# Patient Record
Sex: Female | Born: 2000 | Race: White | Hispanic: No | Marital: Single | Smoking: Former smoker
Health system: Southern US, Community
[De-identification: ages and names within clinical notes are randomized; demographics above are authoritative.]

## PROBLEM LIST (undated history)

## (undated) DIAGNOSIS — R519 Headache, unspecified: Secondary | ICD-10-CM

## (undated) DIAGNOSIS — J45909 Unspecified asthma, uncomplicated: Secondary | ICD-10-CM

## (undated) DIAGNOSIS — F909 Attention-deficit hyperactivity disorder, unspecified type: Secondary | ICD-10-CM

## (undated) DIAGNOSIS — O139 Gestational [pregnancy-induced] hypertension without significant proteinuria, unspecified trimester: Secondary | ICD-10-CM

## (undated) DIAGNOSIS — R51 Headache: Secondary | ICD-10-CM

## (undated) DIAGNOSIS — G43019 Migraine without aura, intractable, without status migrainosus: Secondary | ICD-10-CM

## (undated) DIAGNOSIS — T50902A Poisoning by unspecified drugs, medicaments and biological substances, intentional self-harm, initial encounter: Secondary | ICD-10-CM

## (undated) DIAGNOSIS — B009 Herpesviral infection, unspecified: Secondary | ICD-10-CM

## (undated) DIAGNOSIS — F329 Major depressive disorder, single episode, unspecified: Secondary | ICD-10-CM

## (undated) DIAGNOSIS — F419 Anxiety disorder, unspecified: Secondary | ICD-10-CM

## (undated) DIAGNOSIS — F32A Depression, unspecified: Secondary | ICD-10-CM

## (undated) HISTORY — DX: Headache, unspecified: R51.9

## (undated) HISTORY — DX: Migraine without aura, intractable, without status migrainosus: G43.019

## (undated) HISTORY — PX: NO PAST SURGERIES: SHX2092

## (undated) HISTORY — DX: Headache: R51

---
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
## 2018-04-19 ENCOUNTER — Inpatient Hospital Stay (HOSPITAL_COMMUNITY): Payer: Medicaid Other

## 2018-04-19 ENCOUNTER — Inpatient Hospital Stay (HOSPITAL_COMMUNITY)
Admission: AD | Admit: 2018-04-19 | Discharge: 2018-04-21 | DRG: 788 | Disposition: A | Discharge status: Home / Self Care | Payer: Medicaid Other | Attending: Obstetrics & Gynecology | Admitting: Obstetrics & Gynecology

## 2018-04-19 ENCOUNTER — Encounter (HOSPITAL_COMMUNITY): Payer: Self-pay

## 2018-04-19 ENCOUNTER — Other Ambulatory Visit: Payer: Self-pay

## 2018-04-19 ENCOUNTER — Encounter (HOSPITAL_COMMUNITY)
Admission: AD | Disposition: A | Discharge status: Home / Self Care | Payer: Self-pay | Source: Home / Self Care | Attending: Obstetrics & Gynecology

## 2018-04-19 DIAGNOSIS — Z23 Encounter for immunization: Secondary | ICD-10-CM

## 2018-04-19 DIAGNOSIS — O1494 Unspecified pre-eclampsia, complicating childbirth: Secondary | ICD-10-CM | POA: Diagnosis present

## 2018-04-19 DIAGNOSIS — F319 Bipolar disorder, unspecified: Secondary | ICD-10-CM | POA: Diagnosis present

## 2018-04-19 DIAGNOSIS — O134 Gestational [pregnancy-induced] hypertension without significant proteinuria, complicating childbirth: Secondary | ICD-10-CM | POA: Diagnosis present

## 2018-04-19 DIAGNOSIS — B009 Herpesviral infection, unspecified: Secondary | ICD-10-CM | POA: Diagnosis present

## 2018-04-19 DIAGNOSIS — Z87891 Personal history of nicotine dependence: Secondary | ICD-10-CM

## 2018-04-19 DIAGNOSIS — O9832 Other infections with a predominantly sexual mode of transmission complicating childbirth: Secondary | ICD-10-CM | POA: Diagnosis present

## 2018-04-19 DIAGNOSIS — G43019 Migraine without aura, intractable, without status migrainosus: Secondary | ICD-10-CM | POA: Diagnosis present

## 2018-04-19 DIAGNOSIS — Z9151 Personal history of suicidal behavior: Secondary | ICD-10-CM

## 2018-04-19 DIAGNOSIS — J45909 Unspecified asthma, uncomplicated: Secondary | ICD-10-CM | POA: Diagnosis present

## 2018-04-19 DIAGNOSIS — O9952 Diseases of the respiratory system complicating childbirth: Secondary | ICD-10-CM | POA: Diagnosis not present

## 2018-04-19 DIAGNOSIS — F902 Attention-deficit hyperactivity disorder, combined type: Secondary | ICD-10-CM | POA: Diagnosis present

## 2018-04-19 DIAGNOSIS — A6000 Herpesviral infection of urogenital system, unspecified: Secondary | ICD-10-CM | POA: Diagnosis present

## 2018-04-19 DIAGNOSIS — Z98891 History of uterine scar from previous surgery: Secondary | ICD-10-CM

## 2018-04-19 DIAGNOSIS — Z3A37 37 weeks gestation of pregnancy: Secondary | ICD-10-CM

## 2018-04-19 DIAGNOSIS — Z79899 Other long term (current) drug therapy: Secondary | ICD-10-CM

## 2018-04-19 DIAGNOSIS — O98513 Other viral diseases complicating pregnancy, third trimester: Secondary | ICD-10-CM | POA: Diagnosis present

## 2018-04-19 DIAGNOSIS — Z915 Personal history of self-harm: Secondary | ICD-10-CM

## 2018-04-19 DIAGNOSIS — F913 Oppositional defiant disorder: Secondary | ICD-10-CM | POA: Diagnosis present

## 2018-04-19 DIAGNOSIS — O1493 Unspecified pre-eclampsia, third trimester: Secondary | ICD-10-CM | POA: Diagnosis not present

## 2018-04-19 HISTORY — DX: Herpesviral infection, unspecified: B00.9

## 2018-04-19 HISTORY — DX: Gestational (pregnancy-induced) hypertension without significant proteinuria, unspecified trimester: O13.9

## 2018-04-19 LAB — COMPREHENSIVE METABOLIC PANEL
ALBUMIN: 3 g/dL — AB (ref 3.5–5.0)
ALK PHOS: 108 U/L (ref 38–126)
ALT: 11 U/L (ref 0–44)
ANION GAP: 9 (ref 5–15)
AST: 16 U/L (ref 15–41)
BILIRUBIN TOTAL: 0.5 mg/dL (ref 0.3–1.2)
BUN: 7 mg/dL (ref 6–20)
CALCIUM: 8.7 mg/dL — AB (ref 8.9–10.3)
CO2: 18 mmol/L — ABNORMAL LOW (ref 22–32)
Chloride: 107 mmol/L (ref 98–111)
Creatinine, Ser: 0.49 mg/dL (ref 0.44–1.00)
GFR calc Af Amer: >60 mL/min (ref >60)
GFR calc non Af Amer: >60 mL/min (ref >60)
GLUCOSE: 82 mg/dL (ref 70–99)
Potassium: 4.2 mmol/L (ref 3.5–5.1)
Sodium: 134 mmol/L — ABNORMAL LOW (ref 135–145)
TOTAL PROTEIN: 7.2 g/dL (ref 6.5–8.1)

## 2018-04-19 LAB — OB RESULTS CONSOLE GBS: GBS: NEGATIVE

## 2018-04-19 LAB — CBC
HEMATOCRIT: 36.5 % (ref 36.0–46.0)
HEMOGLOBIN: 11.9 g/dL — AB (ref 12.0–15.0)
MCH: 29.3 pg (ref 26.0–34.0)
MCHC: 32.6 g/dL (ref 30.0–36.0)
MCV: 89.9 fL (ref 80.0–100.0)
Platelets: 208 10*3/uL (ref 150–400)
RBC: 4.06 MIL/uL (ref 3.87–5.11)
RDW: 14.3 % (ref 11.5–15.5)
WBC: 8.2 10*3/uL (ref 4.0–10.5)
nRBC: 0 % (ref 0.0–0.2)

## 2018-04-19 LAB — RAPID URINE DRUG SCREEN, HOSP PERFORMED
Amphetamines: NOT DETECTED
Barbiturates: POSITIVE — AB
Benzodiazepines: NOT DETECTED
Cocaine: NOT DETECTED
Opiates: NOT DETECTED
Tetrahydrocannabinol: NOT DETECTED

## 2018-04-19 LAB — TYPE AND SCREEN
ABO/RH(D): A POS
Antibody Screen: NEGATIVE

## 2018-04-19 LAB — URINALYSIS, ROUTINE W REFLEX MICROSCOPIC
Bilirubin Urine: NEGATIVE
GLUCOSE, UA: NEGATIVE mg/dL
Ketones, ur: NEGATIVE mg/dL
NITRITE: NEGATIVE
PH: 7 (ref 5.0–8.0)
Protein, ur: NEGATIVE mg/dL
SPECIFIC GRAVITY, URINE: 1.013 (ref 1.005–1.030)

## 2018-04-19 LAB — PROTEIN / CREATININE RATIO, URINE
Creatinine, Urine: 73 mg/dL
PROTEIN CREATININE RATIO: 0.27 mg/mg{creat} — AB (ref 0.00–0.15)
Total Protein, Urine: 20 mg/dL

## 2018-04-19 LAB — ABO/RH: ABO/RH(D): A POS

## 2018-04-19 SURGERY — Surgical Case
Anesthesia: Spinal | Site: Abdomen | Wound class: Clean Contaminated

## 2018-04-19 MED ORDER — DIPHENHYDRAMINE HCL 25 MG PO CAPS
25.0000 mg | ORAL_CAPSULE | Freq: Four times a day (QID) | ORAL | Status: DC | PRN
  Administered 2018-04-19: 25 mg via ORAL
  Filled 2018-04-19: qty 1

## 2018-04-19 MED ORDER — PHENYLEPHRINE 8 MG IN D5W 100 ML (0.08MG/ML) PREMIX OPTIME
INJECTION | INTRAVENOUS | Status: DC | PRN
  Administered 2018-04-19: 30 ug/min via INTRAVENOUS

## 2018-04-19 MED ORDER — ENOXAPARIN SODIUM 40 MG/0.4ML ~~LOC~~ SOLN
40.0000 mg | SUBCUTANEOUS | Status: DC
  Administered 2018-04-20: 40 mg via SUBCUTANEOUS
  Filled 2018-04-19: qty 0.4

## 2018-04-19 MED ORDER — OXYCODONE-ACETAMINOPHEN 5-325 MG PO TABS
2.0000 | ORAL_TABLET | ORAL | Status: DC | PRN
  Administered 2018-04-21 (×3): 2 via ORAL
  Filled 2018-04-19 (×3): qty 2

## 2018-04-19 MED ORDER — PRENATAL MULTIVITAMIN CH
1.0000 | ORAL_TABLET | Freq: Every day | ORAL | Status: DC
  Administered 2018-04-20 – 2018-04-21 (×2): 1 via ORAL
  Filled 2018-04-19 (×2): qty 1

## 2018-04-19 MED ORDER — OXYCODONE HCL 5 MG/5ML PO SOLN: 5.0000 mg | Freq: Once | ORAL | Status: DC | PRN

## 2018-04-19 MED ORDER — FENTANYL CITRATE (PF) 100 MCG/2ML IJ SOLN
INTRAMUSCULAR | Status: AC
  Filled 2018-04-19: qty 2

## 2018-04-19 MED ORDER — OXYTOCIN 40 UNITS IN NORMAL SALINE INFUSION - SIMPLE MED: 2.5000 [IU]/h | INTRAVENOUS | Status: AC

## 2018-04-19 MED ORDER — PHENYLEPHRINE 8 MG IN D5W 100 ML (0.08MG/ML) PREMIX OPTIME
INJECTION | INTRAVENOUS | Status: AC
  Filled 2018-04-19: qty 100

## 2018-04-19 MED ORDER — BUPIVACAINE HCL (PF) 0.5 % IJ SOLN
INTRAMUSCULAR | Status: AC
  Filled 2018-04-19: qty 30

## 2018-04-19 MED ORDER — SIMETHICONE 80 MG PO CHEW
80.0000 mg | CHEWABLE_TABLET | ORAL | Status: DC
  Administered 2018-04-20 (×2): 80 mg via ORAL
  Filled 2018-04-19: qty 1

## 2018-04-19 MED ORDER — OXYTOCIN 10 UNIT/ML IJ SOLN
INTRAVENOUS | Status: DC | PRN
  Administered 2018-04-19: 40 [IU] via INTRAVENOUS

## 2018-04-19 MED ORDER — MAGNESIUM HYDROXIDE 400 MG/5ML PO SUSP: 30.0000 mL | ORAL | Status: DC | PRN

## 2018-04-19 MED ORDER — MORPHINE SULFATE (PF) 0.5 MG/ML IJ SOLN
INTRAMUSCULAR | Status: DC | PRN
  Administered 2018-04-19: .15 ug via INTRATHECAL

## 2018-04-19 MED ORDER — WITCH HAZEL-GLYCERIN EX PADS: 1.0000 "application " | MEDICATED_PAD | CUTANEOUS | Status: DC | PRN

## 2018-04-19 MED ORDER — HYDROMORPHONE HCL 1 MG/ML IJ SOLN: 0.2500 mg | INTRAMUSCULAR | Status: DC | PRN

## 2018-04-19 MED ORDER — GLYCOPYRROLATE 0.2 MG/ML IJ SOLN
INTRAMUSCULAR | Status: DC | PRN
  Administered 2018-04-19: 0.2 mg via INTRAVENOUS

## 2018-04-19 MED ORDER — LACTATED RINGERS IV SOLN
INTRAVENOUS | Status: DC
  Administered 2018-04-19: 17:00:00 via INTRAVENOUS

## 2018-04-19 MED ORDER — SENNOSIDES-DOCUSATE SODIUM 8.6-50 MG PO TABS
2.0000 | ORAL_TABLET | ORAL | Status: DC
  Administered 2018-04-19 – 2018-04-20 (×2): 2 via ORAL
  Filled 2018-04-19: qty 2

## 2018-04-19 MED ORDER — ACETAMINOPHEN 325 MG PO TABS: 650.0000 mg | ORAL_TABLET | ORAL | Status: DC | PRN

## 2018-04-19 MED ORDER — COCONUT OIL OIL: 1.0000 "application " | TOPICAL_OIL | Status: DC | PRN

## 2018-04-19 MED ORDER — ZOLPIDEM TARTRATE 5 MG PO TABS: 5.0000 mg | ORAL_TABLET | Freq: Every evening | ORAL | Status: DC | PRN

## 2018-04-19 MED ORDER — MEPERIDINE HCL 25 MG/ML IJ SOLN: 6.2500 mg | INTRAMUSCULAR | Status: DC | PRN

## 2018-04-19 MED ORDER — TETANUS-DIPHTH-ACELL PERTUSSIS 5-2.5-18.5 LF-MCG/0.5 IM SUSP: 0.5000 mL | Freq: Once | INTRAMUSCULAR | Status: DC

## 2018-04-19 MED ORDER — SCOPOLAMINE 1 MG/3DAYS TD PT72
MEDICATED_PATCH | TRANSDERMAL | Status: AC
  Filled 2018-04-19: qty 1

## 2018-04-19 MED ORDER — GABAPENTIN 300 MG PO CAPS
300.0000 mg | ORAL_CAPSULE | Freq: Three times a day (TID) | ORAL | Status: DC
  Administered 2018-04-19 – 2018-04-21 (×6): 300 mg via ORAL
  Filled 2018-04-19 (×9): qty 1

## 2018-04-19 MED ORDER — ONDANSETRON HCL 4 MG/2ML IJ SOLN
INTRAMUSCULAR | Status: AC
  Filled 2018-04-19: qty 2

## 2018-04-19 MED ORDER — MENTHOL 3 MG MT LOZG: 1.0000 | LOZENGE | OROMUCOSAL | Status: DC | PRN

## 2018-04-19 MED ORDER — MEPERIDINE HCL 25 MG/ML IJ SOLN
INTRAMUSCULAR | Status: AC
  Filled 2018-04-19: qty 1

## 2018-04-19 MED ORDER — OXYTOCIN 40 UNITS IN NORMAL SALINE INFUSION - SIMPLE MED: 2.5000 [IU]/h | INTRAVENOUS | Status: DC

## 2018-04-19 MED ORDER — LACTATED RINGERS IV SOLN: 500.0000 mL | INTRAVENOUS | Status: DC | PRN

## 2018-04-19 MED ORDER — CEFAZOLIN SODIUM-DEXTROSE 2-4 GM/100ML-% IV SOLN
INTRAVENOUS | Status: AC
  Filled 2018-04-19: qty 100

## 2018-04-19 MED ORDER — TRAMADOL HCL 50 MG PO TABS
50.0000 mg | ORAL_TABLET | Freq: Four times a day (QID) | ORAL | Status: DC | PRN
  Administered 2018-04-20 (×2): 50 mg via ORAL
  Filled 2018-04-19 (×2): qty 1

## 2018-04-19 MED ORDER — PROMETHAZINE HCL 25 MG/ML IJ SOLN: 6.2500 mg | INTRAMUSCULAR | Status: DC | PRN

## 2018-04-19 MED ORDER — SIMETHICONE 80 MG PO CHEW
80.0000 mg | CHEWABLE_TABLET | ORAL | Status: DC | PRN
  Administered 2018-04-19 – 2018-04-21 (×4): 80 mg via ORAL
  Filled 2018-04-19 (×4): qty 1

## 2018-04-19 MED ORDER — ONDANSETRON HCL 4 MG/2ML IJ SOLN
4.0000 mg | Freq: Four times a day (QID) | INTRAMUSCULAR | Status: DC | PRN
  Administered 2018-04-19: 4 mg via INTRAVENOUS

## 2018-04-19 MED ORDER — OXYCODONE-ACETAMINOPHEN 5-325 MG PO TABS: 1.0000 | ORAL_TABLET | ORAL | Status: DC | PRN

## 2018-04-19 MED ORDER — LACTATED RINGERS IV SOLN
INTRAVENOUS | Status: DC
  Administered 2018-04-20: 05:00:00 via INTRAVENOUS

## 2018-04-19 MED ORDER — OXYCODONE-ACETAMINOPHEN 5-325 MG PO TABS: 2.0000 | ORAL_TABLET | ORAL | Status: DC | PRN

## 2018-04-19 MED ORDER — DEXAMETHASONE SODIUM PHOSPHATE 4 MG/ML IJ SOLN
INTRAMUSCULAR | Status: DC | PRN
  Administered 2018-04-19: 10 mg via INTRAVENOUS

## 2018-04-19 MED ORDER — CEFAZOLIN SODIUM-DEXTROSE 2-3 GM-%(50ML) IV SOLR
INTRAVENOUS | Status: DC | PRN
  Administered 2018-04-19: 2 g via INTRAVENOUS

## 2018-04-19 MED ORDER — DIBUCAINE 1 % RE OINT: 1.0000 "application " | TOPICAL_OINTMENT | RECTAL | Status: DC | PRN

## 2018-04-19 MED ORDER — IBUPROFEN 800 MG PO TABS: 800.0000 mg | ORAL_TABLET | Freq: Four times a day (QID) | ORAL | Status: DC

## 2018-04-19 MED ORDER — FERROUS SULFATE 325 (65 FE) MG PO TABS
325.0000 mg | ORAL_TABLET | Freq: Two times a day (BID) | ORAL | Status: DC
  Administered 2018-04-20 – 2018-04-21 (×3): 325 mg via ORAL
  Filled 2018-04-19 (×4): qty 1

## 2018-04-19 MED ORDER — OXYCODONE HCL 5 MG PO TABS: 5.0000 mg | ORAL_TABLET | Freq: Once | ORAL | Status: DC | PRN

## 2018-04-19 MED ORDER — SCOPOLAMINE 1 MG/3DAYS TD PT72
MEDICATED_PATCH | TRANSDERMAL | Status: DC | PRN
  Administered 2018-04-19: 1 via TRANSDERMAL

## 2018-04-19 MED ORDER — KETOROLAC TROMETHAMINE 30 MG/ML IJ SOLN
30.0000 mg | Freq: Once | INTRAMUSCULAR | Status: AC
  Administered 2018-04-20: 30 mg via INTRAVENOUS

## 2018-04-19 MED ORDER — KETOROLAC TROMETHAMINE 30 MG/ML IJ SOLN: 30.0000 mg | Freq: Once | INTRAMUSCULAR | Status: DC | PRN

## 2018-04-19 MED ORDER — LACTATED RINGERS IV BOLUS
1000.0000 mL | Freq: Once | INTRAVENOUS | Status: AC
  Administered 2018-04-19: 1000 mL via INTRAVENOUS

## 2018-04-19 MED ORDER — SOD CITRATE-CITRIC ACID 500-334 MG/5ML PO SOLN
30.0000 mL | ORAL | Status: DC | PRN
  Administered 2018-04-19: 30 mL via ORAL
  Filled 2018-04-19: qty 15

## 2018-04-19 MED ORDER — OXYTOCIN BOLUS FROM INFUSION: 500.0000 mL | Freq: Once | INTRAVENOUS | Status: DC

## 2018-04-19 MED ORDER — LACTATED RINGERS IV SOLN
INTRAVENOUS | Status: DC | PRN
  Administered 2018-04-19: 19:00:00 via INTRAVENOUS

## 2018-04-19 MED ORDER — MEPERIDINE HCL 25 MG/ML IJ SOLN
INTRAMUSCULAR | Status: DC | PRN
  Administered 2018-04-19: 12.5 mg via INTRAVENOUS

## 2018-04-19 MED ORDER — ALBUTEROL SULFATE (2.5 MG/3ML) 0.083% IN NEBU: 3.0000 mL | INHALATION_SOLUTION | Freq: Four times a day (QID) | RESPIRATORY_TRACT | Status: DC | PRN

## 2018-04-19 MED ORDER — KETOROLAC TROMETHAMINE 30 MG/ML IJ SOLN
30.0000 mg | Freq: Four times a day (QID) | INTRAMUSCULAR | Status: AC
  Administered 2018-04-20: 30 mg via INTRAVENOUS
  Filled 2018-04-19 (×2): qty 1

## 2018-04-19 MED ORDER — FENTANYL CITRATE (PF) 100 MCG/2ML IJ SOLN
INTRAMUSCULAR | Status: DC | PRN
  Administered 2018-04-19: 15 ug via INTRAVENOUS

## 2018-04-19 MED ORDER — MORPHINE SULFATE (PF) 0.5 MG/ML IJ SOLN
INTRAMUSCULAR | Status: AC
  Filled 2018-04-19: qty 10

## 2018-04-19 MED ORDER — BUPIVACAINE IN DEXTROSE 0.75-8.25 % IT SOLN
INTRATHECAL | Status: DC | PRN
  Administered 2018-04-19: 1.3 mL via INTRATHECAL

## 2018-04-19 MED ORDER — OXYTOCIN 10 UNIT/ML IJ SOLN
INTRAMUSCULAR | Status: AC
  Filled 2018-04-19: qty 4

## 2018-04-19 MED ORDER — HYDROMORPHONE HCL 1 MG/ML IJ SOLN
1.0000 mg | INTRAMUSCULAR | Status: DC | PRN
  Administered 2018-04-20: 1 mg via INTRAVENOUS
  Filled 2018-04-19: qty 1

## 2018-04-19 MED ORDER — VALACYCLOVIR HCL 500 MG PO TABS
500.0000 mg | ORAL_TABLET | Freq: Two times a day (BID) | ORAL | Status: DC
  Administered 2018-04-19 – 2018-04-21 (×4): 500 mg via ORAL
  Filled 2018-04-19 (×4): qty 1

## 2018-04-19 MED ORDER — FLEET ENEMA 7-19 GM/118ML RE ENEM: 1.0000 | ENEMA | RECTAL | Status: DC | PRN

## 2018-04-19 MED ORDER — MEASLES, MUMPS & RUBELLA VAC IJ SOLR: 0.5000 mL | Freq: Once | INTRAMUSCULAR | Status: DC

## 2018-04-19 MED ORDER — LIDOCAINE HCL (PF) 1 % IJ SOLN: 30.0000 mL | INTRAMUSCULAR | Status: DC | PRN

## 2018-04-19 MED ORDER — DEXAMETHASONE SODIUM PHOSPHATE 10 MG/ML IJ SOLN
INTRAMUSCULAR | Status: AC
  Filled 2018-04-19: qty 1

## 2018-04-19 SURGICAL SUPPLY — 30 items
CHLORAPREP W/TINT 26ML (MISCELLANEOUS) ×2 IMPLANT
CLAMP CORD UMBIL (MISCELLANEOUS) IMPLANT
CLOTH BEACON ORANGE TIMEOUT ST (SAFETY) ×2 IMPLANT
DRSG OPSITE POSTOP 4X10 (GAUZE/BANDAGES/DRESSINGS) ×2 IMPLANT
ELECT REM PT RETURN 9FT ADLT (ELECTROSURGICAL) ×2
ELECTRODE REM PT RTRN 9FT ADLT (ELECTROSURGICAL) ×1 IMPLANT
EXTRACTOR VACUUM M CUP 4 TUBE (SUCTIONS) IMPLANT
GAUZE SPONGE 4X4 12PLY STRL LF (GAUZE/BANDAGES/DRESSINGS) ×2 IMPLANT
GLOVE BIOGEL PI IND STRL 7.0 (GLOVE) ×3 IMPLANT
GLOVE BIOGEL PI INDICATOR 7.0 (GLOVE) ×3
GLOVE ECLIPSE 7.0 STRL STRAW (GLOVE) ×2 IMPLANT
GOWN STRL REUS W/TWL LRG LVL3 (GOWN DISPOSABLE) ×4 IMPLANT
KIT ABG SYR 3ML LUER SLIP (SYRINGE) IMPLANT
NEEDLE HYPO 22GX1.5 SAFETY (NEEDLE) ×2 IMPLANT
NEEDLE HYPO 25X5/8 SAFETYGLIDE (NEEDLE) ×2 IMPLANT
NS IRRIG 1000ML POUR BTL (IV SOLUTION) ×2 IMPLANT
PACK C SECTION WH (CUSTOM PROCEDURE TRAY) ×2 IMPLANT
PAD ABD 7.5X8 STRL (GAUZE/BANDAGES/DRESSINGS) ×2 IMPLANT
PAD ABD 8X10 STRL (GAUZE/BANDAGES/DRESSINGS) ×2 IMPLANT
PAD OB MATERNITY 4.3X12.25 (PERSONAL CARE ITEMS) ×2 IMPLANT
PENCIL SMOKE EVAC W/HOLSTER (ELECTROSURGICAL) ×2 IMPLANT
RTRCTR C-SECT PINK 25CM LRG (MISCELLANEOUS) IMPLANT
SUT PDS AB 0 CTX 36 PDP370T (SUTURE) IMPLANT
SUT PLAIN 2 0 XLH (SUTURE) IMPLANT
SUT VIC AB 0 CTX 36 (SUTURE) ×2
SUT VIC AB 0 CTX36XBRD ANBCTRL (SUTURE) ×2 IMPLANT
SUT VIC AB 4-0 KS 27 (SUTURE) ×2 IMPLANT
SYR CONTROL 10ML LL (SYRINGE) ×2 IMPLANT
TOWEL OR 17X24 6PK STRL BLUE (TOWEL DISPOSABLE) ×2 IMPLANT
TRAY FOLEY W/BAG SLVR 14FR LF (SET/KITS/TRAYS/PACK) ×2 IMPLANT

## 2018-04-19 NOTE — MAU Provider Note (Signed)
History     CSN: [id number]  Arrival date and time: 04/19/18 1322   First Provider Initiated Contact with Patient 04/19/18 1412      Chief Complaint  Patient presents with  . Vaginal Bleeding  . Back Pain   HPI  Ms.  Jodi Flores is a 18 y.o. year old [id number] female at 6646w0d weeks gestation who presents to MAU reporting intermittent VB/spotting. She was seen at the hospital in [place] last night for the same complaint. She reports "they didn't do anything for me there. They only monitored the baby and sent me home." She reports having gHTN with her last pregnancy. She is worried that she might have that again. She reports her BP was 140/90 something last night when they d/c'd her home.  Past Medical History:  Diagnosis Date  . ADHD (attention deficit hyperactivity disorder)   . Anxiety   . Asthma 03/10/2013   Hx of asthma; no recent use of inhaler  . Common migraine with intractable migraine 12/19/2016  . Depression   . Headache   . HSV infection   . Pregnancy induced hypertension   . Suicidal overdose [facility](HCC)     Past Surgical History:  Procedure Laterality Date  . NO PAST SURGERIES      Family History  Problem Relation Age of Onset  . Bipolar disorder Mother        with anxiety and depression.  Biological father may also have anxiety.   . Migraines Mother   . Cancer Maternal Grandmother     Social History   Tobacco Use  . Smoking status: Former Smoker    Last attempt to quit: 08/17/2017    Years since quitting: 0.6  . Smokeless tobacco: Never Used  Substance Use Topics  . Alcohol use: No  . Drug use: No    Allergies:  Allergies  Allergen Reactions  . Fish Allergy Nausea And Vomiting  . Shellfish Allergy Nausea And Vomiting    Medications Prior to Admission  Medication Sig Dispense Refill Last Dose  . acetaminophen (TYLENOL) 500 MG tablet Take 500-1,000 mg by mouth every 6 (six) hours as needed for headache.   few days ago  .  albuterol (PROVENTIL HFA;VENTOLIN HFA) 108 (90 Base) MCG/ACT inhaler Inhale 2 puffs into the lungs every 6 (six) hours as needed for wheezing or shortness of breath.   few months ago  . cephALEXin (KEFLEX) 500 MG capsule Take 500 mg by mouth 2 (two) times daily. 5 day course completed 09/15/17   09/15/2017  . dicyclomine (BENTYL) 20 MG tablet Take 1 tablet (20 mg total) by mouth 2 (two) times daily as needed for spasms. 20 tablet 0   . doxylamine, Sleep, (UNISOM) 25 MG tablet Take 25 mg by mouth at bedtime.   09/18/2017 at pm  . gabapentin (NEURONTIN) 300 MG capsule Take 1 capsule (300 mg total) by mouth 3 (three) times daily. 90 capsule 6 3-4 weeks ago  . ondansetron (ZOFRAN) 4 MG tablet Take 1 tablet (4 mg total) by mouth every 6 (six) hours. 12 tablet 0   . Prenat-FeAsp-Meth-FA-DHA w/o A (PRENATE PIXIE) 10-0.6-0.4-200 MG CAPS Take 1 tablet by mouth at bedtime.   09/18/2017 at pm  . valACYclovir (VALTREX) 500 MG tablet Take 500 mg by mouth at bedtime.   09/18/2017 at pm  . vitamin B-6 (PYRIDOXINE) 25 MG tablet Take 25 mg by mouth daily.   09/18/2017 at pm    Review of Systems  Constitutional: Negative.  HENT: Negative.   Eyes: Negative.   Respiratory: Negative.   Cardiovascular: Positive for leg swelling.  Gastrointestinal: Negative.   Endocrine: Negative.   Genitourinary: Positive for vaginal bleeding (spotting off and on).  Musculoskeletal: Negative.   Skin: Negative.   Allergic/Immunologic: Negative.   Neurological: Negative.   Hematological: Negative.   Psychiatric/Behavioral: Negative.    Physical Exam   Patient Vitals for the past 24 hrs:  BP Temp Temp src Pulse Resp Height Weight  04/19/18 1600 129/78 - - (!) 122 - - -  04/19/18 1545 (!) 138/92 - - (!) 109 - - -  04/19/18 1530 (!) 145/107 - - (!) 114 - - -  04/19/18 1515 (!) 126/93 - - (!) 116 - - -  04/19/18 1500 138/88 - - 97 - - -  04/19/18 1445 (!) 119/97 - - (!) 105 - - -  04/19/18 1430 (!) 125/93 - - 98 - - -  04/19/18  1426 128/85 - - (!) 102 - - -  04/19/18 1417 (!) 131/97 - - (!) 123 - - -  04/19/18 1355 (!) 129/93 98.3 F (36.8 C) Oral (!) 101 16 5\' 1"  (1.549 m) 63 kg    last menstrual period 07/26/2017.  Physical Exam  Nursing note and vitals reviewed. Constitutional: She is oriented to person, place, and time. She appears well-developed and well-nourished.  HENT:  Head: Normocephalic and atraumatic.  Eyes: Pupils are equal, round, and reactive to light.  Neck: Normal range of motion.  Cardiovascular: Normal rate.  Respiratory: Effort normal.  GI: Soft.  Genitourinary:    Genitourinary Comments: Dilation: 1.5 Effacement (%): 60 Station: -3 Exam by: Raelyn Mora CNM     Musculoskeletal: Normal range of motion.  Neurological: She is alert and oriented to person, place, and time.  Skin: Skin is warm and dry.  Psychiatric: She has a normal mood and affect. Her behavior is normal. Judgment and thought content normal.    MAU Course  Procedures Patient informed that the ultrasound is considered a limited OB ultrasound and is not intended to be a complete ultrasound exam.  Patient also informed that the ultrasound is not being completed with the intent of assessing for fetal or placental anomalies or any pelvic abnormalities.  Explained that the purpose of today's ultrasound is to assess for  presentation.  Patient acknowledges the purpose of the exam and the limitations of the study.  Fetus in cephalic presentation found on informal U/S  MDM CCUA CBC CMP P/C Ratio Serial BPs IV: LR bolus 1000 ml  Informal BS U/S NST - FHR: 140 bpm / moderate variability / accels present / decels absent / TOCO: UI noted  *Consult with Dr. Jolayne Panther @ [phone] - notified of patient's complaints, assessments, lab & NST results, tx plan admit for IOL - agrees with plan  Results for orders placed or performed during the hospital encounter of 04/19/18 (from the past 24 hour(s))  Protein / creatinine ratio, urine      Status: Abnormal   Collection Time: 04/19/18  1:46 PM  Result Value Ref Range   Creatinine, Urine 73.00 mg/dL   Total Protein, Urine 20 mg/dL   Protein Creatinine Ratio 0.27 (H) 0.00 - 0.15 mg/mg[Cre]  Urinalysis, Routine w reflex microscopic     Status: Abnormal   Collection Time: 04/19/18  1:50 PM  Result Value Ref Range   Color, Urine YELLOW YELLOW   APPearance CLEAR CLEAR   Specific Gravity, Urine 1.013 1.005 - 1.030  pH 7.0 5.0 - 8.0   Glucose, UA NEGATIVE NEGATIVE mg/dL   Hgb urine dipstick MODERATE (A) NEGATIVE   Bilirubin Urine NEGATIVE NEGATIVE   Ketones, ur NEGATIVE NEGATIVE mg/dL   Protein, ur NEGATIVE NEGATIVE mg/dL   Nitrite NEGATIVE NEGATIVE   Leukocytes, UA MODERATE (A) NEGATIVE   RBC / HPF 0-5 0 - 5 RBC/hpf   WBC, UA 0-5 0 - 5 WBC/hpf   Bacteria, UA RARE (A) NONE SEEN   Squamous Epithelial / LPF 6-10 0 - 5   Mucus PRESENT   CBC     Status: Abnormal   Collection Time: 04/19/18  3:05 PM  Result Value Ref Range   WBC 8.2 4.0 - 10.5 K/uL   RBC 4.06 3.87 - 5.11 MIL/uL   Hemoglobin 11.9 (L) 12.0 - 15.0 g/dL   HCT 13.036.5 86.536.0 - 78.446.0 %   MCV 89.9 80.0 - 100.0 fL   MCH 29.3 26.0 - 34.0 pg   MCHC 32.6 30.0 - 36.0 g/dL   RDW 69.614.3 29.511.5 - 28.415.5 %   Platelets 208 150 - 400 K/uL   nRBC 0.0 0.0 - 0.2 %  Comprehensive metabolic panel     Status: Abnormal   Collection Time: 04/19/18  3:05 PM  Result Value Ref Range   Sodium 134 (L) 135 - 145 mmol/L   Potassium 4.2 3.5 - 5.1 mmol/L   Chloride 107 98 - 111 mmol/L   CO2 18 (L) 22 - 32 mmol/L   Glucose, Bld 82 70 - 99 mg/dL   BUN 7 6 - 20 mg/dL   Creatinine, Ser 1.320.49 0.44 - 1.00 mg/dL   Calcium 8.7 (L) 8.9 - 10.3 mg/dL   Total Protein 7.2 6.5 - 8.1 g/dL   Albumin 3.0 (L) 3.5 - 5.0 g/dL   AST 16 15 - 41 U/L   ALT 11 0 - 44 U/L   Alkaline Phosphatase 108 38 - 126 U/L   Total Bilirubin 0.5 0.3 - 1.2 mg/dL   GFR calc non Af Amer >60 >60 mL/min   GFR calc Af Amer >60 >60 mL/min   Anion gap 9 5 - 15    Assessment and  Plan  Preeclampsia, third trimester - Admit to L&D for IOL - Orders being entered by Dr. Merlene PullingAnderson   Katriona Schmierer, MSN, CNM 04/19/2018, 2:13 PM

## 2018-04-19 NOTE — Anesthesia Procedure Notes (Signed)
Spinal  Patient location during procedure: OR Staffing Anesthesiologist: Montez Hageman, MD Performed: anesthesiologist  Preanesthetic Checklist Completed: patient identified, site marked, surgical consent, pre-op evaluation, timeout performed, IV checked, risks and benefits discussed and monitors and equipment checked Spinal Block Patient position: sitting Prep: DuraPrep Patient monitoring: heart rate, continuous pulse ox and blood pressure Approach: midline Location: L2-3 Injection technique: single-shot Needle Needle type: Sprotte  Needle gauge: 24 G Needle length: 9 cm Additional Notes Expiration date of kit checked and confirmed. Patient tolerated procedure well, without complications.

## 2018-04-19 NOTE — Anesthesia Preprocedure Evaluation (Signed)
Anesthesia Evaluation  Patient identified by MRN, date of birth, ID band Patient awake    Reviewed: Allergy & Precautions, NPO status , Patient's Chart, lab work & pertinent test results  Airway Mallampati: II  TM Distance: >3 FB Neck ROM: Full    Dental no notable dental hx.    Pulmonary asthma , former smoker,    Pulmonary exam normal breath sounds clear to auscultation       Cardiovascular hypertension, negative cardio ROS Normal cardiovascular exam Rhythm:Regular Rate:Normal     Neuro/Psych  Headaches, Anxiety Depression Bipolar Disorder negative psych ROS   GI/Hepatic negative GI ROS, Neg liver ROS,   Endo/Other  negative endocrine ROS  Renal/GU negative Renal ROS  negative genitourinary   Musculoskeletal negative musculoskeletal ROS (+)   Abdominal   Peds negative pediatric ROS (+)  Hematology negative hematology ROS (+)   Anesthesia Other Findings   Reproductive/Obstetrics (+) Pregnancy                             Anesthesia Physical Anesthesia Plan  ASA: II  Anesthesia Plan: Spinal   Post-op Pain Management:    Induction:   PONV Risk Score and Plan: 2 and Treatment may vary due to age or medical condition  Airway Management Planned: Natural Airway  Additional Equipment:   Intra-op Plan:   Post-operative Plan:   Informed Consent: I have reviewed the patients History and Physical, chart, labs and discussed the procedure including the risks, benefits and alternatives for the proposed anesthesia with the patient or authorized representative who has indicated his/her understanding and acceptance.     Dental advisory given  Plan Discussed with: CRNA  Anesthesia Plan Comments:         Anesthesia Quick Evaluation

## 2018-04-19 NOTE — Transfer of Care (Signed)
Immediate Anesthesia Transfer of Care Note  Patient: Jodi Flores  Procedure(s) Performed: CESAREAN SECTION (N/A Abdomen)  Patient Location: PACU  Anesthesia Type:Spinal  Level of Consciousness: awake, alert  and oriented  Airway & Oxygen Therapy: Patient Spontanous Breathing  Post-op Assessment: Report given to RN and Post -op Vital signs reviewed and stable  Post vital signs: Reviewed and stable HR 83, RR 18, SaO2 99%, BP 115/66  Last Vitals:  Vitals Value Taken Time  BP    Temp    Pulse 81 04/19/2018  8:00 PM  Resp 7 04/19/2018  8:00 PM  SpO2 100 % 04/19/2018  8:00 PM  Vitals shown include unvalidated device data.  Last Pain:  Vitals:   04/19/18 1709  TempSrc:   PainSc: 0-No pain         Complications: No apparent anesthesia complications

## 2018-04-19 NOTE — Progress Notes (Signed)
Patient complaining of HSV symptoms.  Dr Aneta MinsPhillip at bedside to visualize for lesions

## 2018-04-19 NOTE — MAU Note (Signed)
Urine sent to lab 

## 2018-04-19 NOTE — Op Note (Signed)
Jodi Flores PROCEDURE DATE: 04/19/2018  PREOPERATIVE DIAGNOSES: Intrauterine pregnancy at [redacted]w[redacted]d weeks gestation; active herpes virus genital infection; preeclampsia  POSTOPERATIVE DIAGNOSES: The same  PROCEDURE: Primary Low Transverse Cesarean Section  SURGEON:  Dr. Jaynie Collins  ASSISTANT:  Dr. Gwenevere Abbot  ANESTHESIOLOGY TEAM: Anesthesiologist: Phillips Grout, MD CRNA: Rhymer, Doree Fudge, CRNA  INDICATIONS: Jodi Flores is a 18 y.o. [phone] at [redacted]w[redacted]d here for cesarean section secondary to the indications listed under preoperative diagnoses; please see preoperative note for further details.  The risks of cesarean section were discussed with the patient including but were not limited to: bleeding which may require transfusion or reoperation; infection which may require antibiotics; injury to bowel, bladder, ureters or other surrounding organs; injury to the fetus; need for additional procedures including hysterectomy in the event of a life-threatening hemorrhage; placental abnormalities wth subsequent pregnancies, incisional problems, thromboembolic phenomenon and other postoperative/anesthesia complications.   The patient concurred with the proposed plan, giving informed written consent for the procedure.    FINDINGS:  Viable female infant in cephalic presentation.  Apgars pending at time of note but baby stable in room and doing skin-to-skin with mother. Copious amount of clear amniotic fluid.  Intact placenta, three vessel cord.  Normal uterus, fallopian tubes and ovaries bilaterally.  ANESTHESIA: Spinal INTRAVENOUS FLUIDS: 500 ml   ESTIMATED BLOOD LOSS: 441 ml as per [facility] URINE OUTPUT:  100 ml SPECIMENS: Placenta sent to pathology COMPLICATIONS: None immediate  PROCEDURE IN DETAIL:  The patient preoperatively received intravenous antibiotics and had sequential compression devices applied to her lower extremities.  She was then taken to  the operating room where spinal anesthesia was administered and was found to be adequate. She was then placed in a dorsal supine position with a leftward tilt, and prepped and draped in a sterile manner.  A foley catheter was placed into her bladder and attached to constant gravity.  After an adequate timeout was performed, a Pfannenstiel skin incision was made with scalpel and carried through to the underlying layer of fascia. The fascia was incised in the midline, and this incision was extended bilaterally using the Mayo scissors.  Kocher clamps were applied to the superior aspect of the fascial incision and the underlying rectus muscles were dissected off bluntly and sharply.  A similar process was carried out on the inferior aspect of the fascial incision. The rectus muscles were separated in the midline and the peritoneum was entered bluntly. The Alexis self-retaining retractor was introduced into the abdominal cavity.  Attention was turned to the lower uterine segment where a low transverse hysterotomy was made with a scalpel and extended bilaterally bluntly.  The infant was successfully delivered, the cord was clamped and cut after one minute, and the infant was handed over to the awaiting neonatology team. Uterine massage was then administered, and the placenta delivered intact with a three-vessel cord. The uterus was then cleared of clots and debris.  The hysterotomy was closed with 0 Vicryl in a running locked fashion, and an imbricating layer was also placed with 0 Vicryl.  One figure-of-eight 0 Vicryl serosal stitch was placed to help with hemostasis.  The pelvis was cleared of all clot and debris. Hemostasis was confirmed on all surfaces.  The retractor was removed.  The peritoneum was closed with a 0 Vicryl running stitch. The fascia was then closed using 0 Vicryl in a running fashion.  The subcutaneous layer was irrigated, and the skin was closed with a 4-0 Vicryl  subcuticular stitch. The patient  tolerated the procedure well. Sponge, instrument and needle counts were correct x 3.  She was taken to the recovery room in stable condition.    Terrius Gentile, MD, FACOG Obstetrician & Gynecologist, [facility], [facility]

## 2018-04-19 NOTE — H&P (Signed)
LABOR AND DELIVERY ADMISSION HISTORY AND PHYSICAL NOTE  Jodi Flores is a 18 y.o. female G2P1001 with IUP at 8222w0d by LMP=6w US presenting with vaginal bleeding for IOL for gHTN.  Patient reports that she's had 2 recent HSV outbreaks (last 2 weeks ago).  Her dose of valtrex was increased, but she's still having tingling, which is her usual prodromal symptom.   She reports positive fetal movement. She denies leakage of fluid or vaginal bleeding.  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 ADHD (attention deficit hyperactivity disorder)   . Anxiety   . Asthma 03/10/2013   Hx of asthma; no recent use of inhaler  . Common migraine with intractable migraine 12/19/2016  . Depression   . Headache   . HSV infection   . Pregnancy induced hypertension   . Suicidal overdose [facility](HCC)     Past Surgical History: Past Surgical History:  Procedure Laterality Date  . NO PAST SURGERIES      Obstetrical History: OB History    Gravida  2   Para  1   Term  1   Preterm      AB      Living  1     SAB      TAB      Ectopic      Multiple      Live Births  1           Social History: Social History   Socioeconomic History  . Marital status: Single    Spouse name: Not on file  . Number of children: Not on file  . Years of education: Not on file  . Highest education level: Not on file  Occupational History  . Not on file  Social Needs  . Financial resource strain: Somewhat hard  . Food insecurity:    Worry: Sometimes true    Inability: Sometimes true  . Transportation needs:    Medical: Yes    Non-medical: Yes  Tobacco Use  . Smoking status: Former Smoker    Last attempt to quit: 08/17/2017    Years since quitting: 0.6  . Smokeless tobacco: Never Used  Substance and Sexual Activity  .  Alcohol use: No  . Drug use: No  . Sexual activity: Yes    Birth control/protection: None  Lifestyle  . Physical activity:    Days per week: 7 days    Minutes per session: 50 min  . Stress: Only a little  Relationships  . Social connections:    Talks on phone: More than three times a week    Gets together: More than three times a week    Attends religious service: Never    Active member of club or organization: No    Attends meetings of clubs or organizations: Not on file    Relationship status: Patient refused  Other Topics Concern  . Not on file  Social History Narrative  . Not on file    Family History: Family History  Problem Relation Age of Onset  . Bipolar disorder Mother        with anxiety and depression.  Biological father may also have anxiety.   . Migraines Mother   . Cancer Maternal Grandmother     Allergies: Allergies  Allergen Reactions  . Fish Allergy Nausea And Vomiting  .  Shellfish Allergy Nausea And Vomiting    Medications Prior to Admission  Medication Sig Dispense Refill Last Dose  . metoCLOPramide (REGLAN) 5 MG tablet Take by mouth.   Past Week at Unknown time  . Prenat-FeAsp-Meth-FA-DHA w/o A (PRENATE PIXIE) 10-0.6-0.4-200 MG CAPS Take 1 tablet by mouth at bedtime.   04/18/2018 at Unknown time  . valACYclovir (VALTREX) 500 MG tablet Take 500 mg by mouth at bedtime.   04/19/2018 at Unknown time  . albuterol (PROVENTIL HFA;VENTOLIN HFA) 108 (90 Base) MCG/ACT inhaler Inhale 2 puffs into the lungs every 6 (six) hours as needed for wheezing or shortness of breath.   few months ago  . gabapentin (NEURONTIN) 300 MG capsule Take 1 capsule (300 mg total) by mouth 3 (three) times daily. 90 capsule 6 3-4 weeks ago     Review of Systems  All systems reviewed and negative except as stated in HPI  Physical Exam Blood pressure 131/88, pulse 95, temperature 98.7 F (37.1 C), temperature source Oral, resp. rate 18, height 5\' 1"  (1.549 m), weight 63 kg, last  menstrual period 07/26/2017. General appearance: alert, oriented, NAD Lungs: normal respiratory effort Heart: regular rate Abdomen: soft, non-tender; gravid, FH appropriate for GA Extremities: No calf swelling or tenderness Presentation: cephalic Fetal monitoring:  Uterine activity: irritability  Dilation: 1.5 Effacement (%): 60 Station: -3 Exam by:: Raelyn Moraolitta Dawson CNM   Prenatal labs: ABO, Rh:   A pos Antibody:   Neg Rubella:   Immune RPR:   Negative HBsAg:   Negative HIV:   Negative GC/Chlamydia: Negative/Negative GBS: Negative (01/27 0000)  1-hr GTT: 100 Genetic screening:  Results unavailable Anatomy US: Normal P:Cr Ratio: 0.27 Urinalysis    Component Value Date/Time   COLORURINE YELLOW 04/19/2018 1350   APPEARANCEUR CLEAR 04/19/2018 1350   LABSPEC 1.013 04/19/2018 1350   PHURINE 7.0 04/19/2018 1350   GLUCOSEU NEGATIVE 04/19/2018 1350   HGBUR MODERATE (A) 04/19/2018 1350   BILIRUBINUR NEGATIVE 04/19/2018 1350   KETONESUR NEGATIVE 04/19/2018 1350   PROTEINUR NEGATIVE 04/19/2018 1350   UROBILINOGEN 0.2 03/09/2013 1909   NITRITE NEGATIVE 04/19/2018 1350   LEUKOCYTESUR MODERATE (A) 04/19/2018 1350   Prenatal Transfer Tool  Maternal Diabetes: No Genetic Screening: Results unavailable on care everywhere Maternal Ultrasounds/Referrals: Normal Fetal Ultrasounds or other Referrals:  None Maternal Substance Abuse:  Yes:  Type: Marijuana, Cocaine (history with negative drug screen 12/15/17) Significant Maternal Medications:  Meds include: Other: Gabapentin, valtrex, albuterol Significant Maternal Lab Results: None  Results for orders placed or performed during the hospital encounter of 04/19/18 (from the past 24 hour(s))  OB RESULT CONSOLE Group B Strep   Collection Time: 04/19/18 12:00 AM  Result Value Ref Range   GBS Negative   Protein / creatinine ratio, urine   Collection Time: 04/19/18  1:46 PM  Result Value Ref Range   Creatinine, Urine 73.00 mg/dL   Total  Protein, Urine 20 mg/dL   Protein Creatinine Ratio 0.27 (H) 0.00 - 0.15 mg/mg[Cre]  Urinalysis, Routine w reflex microscopic   Collection Time: 04/19/18  1:50 PM  Result Value Ref Range   Color, Urine YELLOW YELLOW   APPearance CLEAR CLEAR   Specific Gravity, Urine 1.013 1.005 - 1.030   pH 7.0 5.0 - 8.0   Glucose, UA NEGATIVE NEGATIVE mg/dL   Hgb urine dipstick MODERATE (A) NEGATIVE   Bilirubin Urine NEGATIVE NEGATIVE   Ketones, ur NEGATIVE NEGATIVE mg/dL   Protein, ur NEGATIVE NEGATIVE mg/dL   Nitrite NEGATIVE NEGATIVE   Leukocytes, UA MODERATE (  A) NEGATIVE   RBC / HPF 0-5 0 - 5 RBC/hpf   WBC, UA 0-5 0 - 5 WBC/hpf   Bacteria, UA RARE (A) NONE SEEN   Squamous Epithelial / LPF 6-10 0 - 5   Mucus PRESENT   CBC   Collection Time: 04/19/18  3:05 PM  Result Value Ref Range   WBC 8.2 4.0 - 10.5 K/uL   RBC 4.06 3.87 - 5.11 MIL/uL   Hemoglobin 11.9 (L) 12.0 - 15.0 g/dL   HCT 24.4 01.0 - 27.2 %   MCV 89.9 80.0 - 100.0 fL   MCH 29.3 26.0 - 34.0 pg   MCHC 32.6 30.0 - 36.0 g/dL   RDW 53.6 64.4 - 03.4 %   Platelets 208 150 - 400 K/uL   nRBC 0.0 0.0 - 0.2 %  Comprehensive metabolic panel   Collection Time: 04/19/18  3:05 PM  Result Value Ref Range   Sodium 134 (L) 135 - 145 mmol/L   Potassium 4.2 3.5 - 5.1 mmol/L   Chloride 107 98 - 111 mmol/L   CO2 18 (L) 22 - 32 mmol/L   Glucose, Bld 82 70 - 99 mg/dL   BUN 7 6 - 20 mg/dL   Creatinine, Ser 7.42 0.44 - 1.00 mg/dL   Calcium 8.7 (L) 8.9 - 10.3 mg/dL   Total Protein 7.2 6.5 - 8.1 g/dL   Albumin 3.0 (L) 3.5 - 5.0 g/dL   AST 16 15 - 41 U/L   ALT 11 0 - 44 U/L   Alkaline Phosphatase 108 38 - 126 U/L   Total Bilirubin 0.5 0.3 - 1.2 mg/dL   GFR calc non Af Amer >60 >60 mL/min   GFR calc Af Amer >60 >60 mL/min   Anion gap 9 5 - 15    Patient Active Problem List   Diagnosis Date Noted  . Preeclampsia, 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  . 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 History of suicide attempt  04/19/2018  . Common migraine with intractable migraine 12/19/2016  . Bipolar disorder (HCC) 03/10/2013  . Oppositional defiant disorder 03/10/2013  . ADHD (attention deficit hyperactivity disorder), combined type 03/10/2013    Assessment: Jodi Flores is a 18 y.o. G2P1001 at [redacted]w[redacted]d here for IOL for gHTN.  #Labor: latent; pt will need pLTCS due to recent HSV outbreak and current prodromal symptoms. The risks of cesarean section discussed with the patient included but were not limited to: bleeding which may require transfusion or reoperation; infection which may require antibiotics; injury to bowel, bladder, ureters or other surrounding organs; injury to the fetus; need for additional procedures including hysterectomy in the event of a life-threatening hemorrhage; placental abnormalities wth subsequent pregnancies, incisional problems, thromboembolic phenomenon and other postoperative/anesthesia complications. The patient concurred with the proposed plan, giving informed written consent for the procedure. Patient has been NPO since last night she will remain NPO for procedure. Anesthesia and OR aware. Preoperative prophylactic antibiotics and SCDs ordered on call to the OR. To OR when ready.   #FWB: Category 1 #ID:  GBS Negative #MOC: LARC #Circ:  NA  Jodi Flores 04/19/2018, 5:57 PM

## 2018-04-19 NOTE — MAU Note (Signed)
Pt c/o of vaginal bleeding from light to heavy, gets care [place] at [facility]. Has been to the hospital and was monitored and discharged. Last appointment was the 22nd. No LOF and +FM

## 2018-04-19 NOTE — Discharge Summary (Signed)
Postpartum Discharge Summary     Patient Name: Jodi Flores DOB: Aug 21, 2000 MRN: [id number]  Date of admission: 04/19/2018 Delivering Provider: Gwenevere Abbot   Date of discharge: 04/21/2018  Admitting diagnosis: 37WKS BLEEDING Intrauterine pregnancy: [redacted]w[redacted]d     Secondary diagnosis:  Principal Problem:   S/P cesarean section Active Problems:   Bipolar disorder (HCC)   Oppositional defiant disorder   ADHD (attention deficit hyperactivity disorder), combined type   Common migraine with intractable migraine   Preeclampsia, third trimester   HSV-2 infection complicating pregnancy, third trimester   History of suicide attempt   Lithium use  Additional problems: pLTCS for active HSV     Discharge diagnosis: Term Pregnancy Delivered                                                                                                Post partum procedures:none  Augmentation: NA  Complications: None  Hospital course:  Induction of Labor With Cesarean Section  18 y.o. yo G2P2002 at [redacted]w[redacted]d was admitted to the hospital 04/19/2018 for induction of labor. Patient had a labor course significant for pLTCS prior to starting her induction due to active HSV infection. The patient went for cesarean section due to Active HSV, and delivered a Viable infant,04/19/2018  Membrane Rupture Time/Date: 7:05 PM ,04/19/2018   Details of operation can be found in separate operative Note.  Patient had an uncomplicated postpartum course. She is ambulating, tolerating a regular diet, passing flatus, and urinating well.  Patient is discharged home in stable condition on 04/21/18.                                    Magnesium Sulfate recieved: No BMZ received: No  Physical exam  Vitals:   04/20/18 1227 04/20/18 1733 04/20/18 1911 04/21/18 0551  BP: 106/77 107/68 119/77 103/83  Pulse: 77 78 84 95  Resp: 18 18 18 18   Temp: 98.3 F (36.8 C) 98.4 F (36.9 C) 97.7 F (36.5 C) 98.3 F (36.8 C)   TempSrc: Oral Oral Oral Oral  SpO2: 99% 99% 99%   Weight:      Height:       General: alert, cooperative and no distress Lochia: appropriate Uterine Fundus: firm Incision: Healing well with no significant drainage, No significant erythema, Dressing is clean, dry, and intact DVT Evaluation: No evidence of DVT seen on physical exam. Labs: Lab Results  Component Value Date   WBC 13.0 (H) 04/20/2018   HGB 10.3 (L) 04/20/2018   HCT 31.2 (L) 04/20/2018   MCV 88.6 04/20/2018   PLT 188 04/20/2018   CMP Latest Ref Rng & Units 04/20/2018  Glucose 70 - 99 mg/dL -  BUN 6 - 20 mg/dL -  Creatinine 8.36 - 6.29 mg/dL 4.76  Sodium 546 - 503 mmol/L -  Potassium 3.5 - 5.1 mmol/L -  Chloride 98 - 111 mmol/L -  CO2 22 - 32 mmol/L -  Calcium 8.9 - 10.3 mg/dL -  Total Protein 6.5 - 8.1 g/dL -  Total Bilirubin 0.3 - 1.2 mg/dL -  Alkaline Phos 38 - 841 U/L -  AST 15 - 41 U/L -  ALT 0 - 44 U/L -    Discharge instruction: per After Visit Summary and "Baby and Me Booklet".  After visit meds:  Allergies as of 04/21/2018      Reactions   Fish Allergy Nausea And Vomiting   Shellfish Allergy Nausea And Vomiting      Medication List    STOP taking these medications   metoCLOPramide 5 MG tablet Commonly known as:  REGLAN     TAKE these medications   acetaminophen 325 MG tablet Commonly known as:  TYLENOL Take 2 tablets (650 mg total) by mouth every 4 (four) hours as needed.   albuterol 108 (90 Base) MCG/ACT inhaler Commonly known as:  PROVENTIL HFA;VENTOLIN HFA Inhale 2 puffs into the lungs every 6 (six) hours as needed for wheezing or shortness of breath.   gabapentin 300 MG capsule Commonly known as:  NEURONTIN Take 1 capsule (300 mg total) by mouth 3 (three) times daily.   ibuprofen 800 MG tablet Commonly known as:  ADVIL,MOTRIN Take 1 tablet (800 mg total) by mouth every 6 (six) hours.   oxyCODONE-acetaminophen 5-325 MG tablet Commonly known as:  PERCOCET/ROXICET Take 2  tablets by mouth every 4 (four) hours as needed for up to 5 days for severe pain ((when tolerating fluids)).   PRENATE PIXIE 10-0.6-0.4-200 MG Caps Take 1 tablet by mouth at bedtime.   valACYclovir 500 MG tablet Commonly known as:  VALTREX Take 500 mg by mouth at bedtime.       Diet: routine diet  Activity: Advance as tolerated. Pelvic rest for 6 weeks.   Outpatient follow up: Patient has BP check at [facility] on Monday 04/26/2018 Follow up Appt: Future Appointments  Date Time Provider Department Center  04/26/2018  2:05 PM [facility] NURSE [facility]   Patient plans to have her immediate postpartum appointments managed by [facility] Newborn Data: Live born female  Birth Weight:   APGAR: 9, 9  Newborn Delivery   Birth date/time:  04/19/2018 19:06:00 Delivery type:  C-Section, Low Transverse C-section categorization:  Primary     Baby Feeding: Bottle and breast Disposition:home with mother  Patient requesting discharge at 4:15pm. Her mother is off work this evening and all day tomorrow and is available to provide transportation home this evening. She is aware of her postpartum appointment Monday at [facility]. She denies complaints or concerns at time of discharge  04/21/2018 Calvert Cantor, CNM

## 2018-04-20 LAB — CBC
HCT: 31.2 % — ABNORMAL LOW (ref 36.0–46.0)
HEMOGLOBIN: 10.3 g/dL — AB (ref 12.0–15.0)
MCH: 29.3 pg (ref 26.0–34.0)
MCHC: 33 g/dL (ref 30.0–36.0)
MCV: 88.6 fL (ref 80.0–100.0)
Platelets: 188 10*3/uL (ref 150–400)
RBC: 3.52 MIL/uL — ABNORMAL LOW (ref 3.87–5.11)
RDW: 14 % (ref 11.5–15.5)
WBC: 13 10*3/uL — ABNORMAL HIGH (ref 4.0–10.5)
nRBC: 0 % (ref 0.0–0.2)

## 2018-04-20 LAB — RPR: RPR Ser Ql: NONREACTIVE

## 2018-04-20 LAB — CULTURE, OB URINE: SPECIAL REQUESTS: NORMAL

## 2018-04-20 LAB — CREATININE, SERUM
Creatinine, Ser: 0.62 mg/dL (ref 0.44–1.00)
GFR calc Af Amer: >60 mL/min (ref >60)
GFR calc non Af Amer: >60 mL/min (ref >60)

## 2018-04-20 MED ORDER — MEDROXYPROGESTERONE ACETATE 150 MG/ML IM SUSP
150.0000 mg | Freq: Once | INTRAMUSCULAR | Status: AC
  Administered 2018-04-21: 150 mg via INTRAMUSCULAR
  Filled 2018-04-20: qty 1

## 2018-04-20 NOTE — Progress Notes (Addendum)
Subjective: Postpartum Day #1: Cesarean Delivery Patient reports tolerating PO. Denies dizziness. Still with foley cath in place. Breast and bottlefeeding; desires Depo for contraception (reports 'numb arm' with Nexplanon in the past). Normotensive since delivery.  Objective: Vital signs in last 24 hours: Temp:  [97.4 F (36.3 C)-98.7 F (37.1 C)] 98.4 F (36.9 C) (01/28 0900) Pulse Rate:  [70-123] 76 (01/28 0900) Resp:  [14-30] 18 (01/28 0900) BP: (105-145)/(6-107) 108/70 (01/28 0900) SpO2:  [98 %-100 %] 100 % (01/28 0900) Weight:  [11 kg] 63 kg (01/27 1355)  Physical Exam:  General: cooperative, fatigued and no distress Lochia: appropriate Uterine Fundus: firm Incision: pressure dsg intact DVT Evaluation: No evidence of DVT seen on physical exam.  Recent Labs    04/19/18 1505 04/20/18 0601  HGB 11.9* 10.3*  HCT 36.5 31.2*    Assessment/Plan: Status post Cesarean section. Doing well postoperatively.  Continue current care. SW consult pending. Plan Depo prior to discharge. D/C foley and ambulate today.  Arabella Merles CNM 04/20/2018, 11:33 AM

## 2018-04-20 NOTE — Lactation Note (Signed)
This note was copied from a baby's chart. Lactation Consultation Note  Patient Name: Jodi Flores [id number] Date: 04/20/2018 Reason for consult: Initial assessment;Early term 37-38.6wks  0937 - 48 - I visited  [name] to offer assistance with breast feeding. Upon entry, I noted about 10 mls of pumped colostrum in her manual pump. [name] states that her baby has latched briefly a few times but has been sleepy.  We discussed early term feeding behaviors, and I encouraged her to continue to use her manual pump to stimulate milk production, particularly when baby is too sleepy to latch. We discussed feeding cues and feeding frequency and duration. I educated on day one infant feeding patterns and nighttime cluster feeding.  I offered to assist with latch. Baby latched briefly in football hold on mom's right breast twice. Each time, baby "Aurora" would suckle with good jaw movement and good breast tissue movement and then fall asleep after a few moments. Prior to latching baby, I noted that baby smacks lips and appears to suck on her tongue.   I showed mom how to wait for baby to lower jaw more deeply before latching to prevent baby from smacking her way onto the breast. Baby was able to attain latch, but energy level is low, and baby will not sustain suckling.  I recommended that she feed her expressed milk to baby, and I provided her with a syringe. I suggested we could also try to provide expressed milk by feeding tube device if she wished. Baby is sleepy, and mom will feed her expressed milk within an hour. She wishes to offer it after baby's bath.  I provided mom with pump cleaning supplies and educated on how to clean her manual pump.    I recommended that she page as needed today for additional help with latch. Mom verbalized understanding.   Maternal Data Has patient been taught Hand Expression?: Yes Does the patient have breastfeeding experience  prior to this delivery?: Yes  Feeding Feeding Type: Other (comment)  LATCH Score Latch: Repeated attempts needed to sustain latch, nipple held in mouth throughout feeding, stimulation needed to elicit sucking reflex.  Audible Swallowing: None  Type of Nipple: Everted at rest and after stimulation  Comfort (Breast/Nipple): Soft / non-tender  Hold (Positioning): Assistance needed to correctly position infant at breast and maintain latch.  LATCH Score: 6  Interventions Interventions: Breast feeding basics reviewed;Assisted with latch;Skin to skin;Hand express;Breast compression;Support pillows;Expressed milk;Hand pump  Lactation Tools Discussed/Used Pump Review: Setup, frequency, and cleaning;Milk Storage Date initiated:: 04/20/18   Consult Status Consult Status: Follow-up Date: 04/21/18 Follow-up type: Call as needed    Walker Shadow 04/20/2018, 10:11 AM
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
## 2018-04-20 NOTE — Anesthesia Postprocedure Evaluation (Signed)
Anesthesia Post Note  Patient: Roschell Hermon Uplinger-sheehan  Procedure(s) Performed: CESAREAN SECTION (N/A Abdomen)     Patient location during evaluation: Mother Baby Anesthesia Type: Spinal Level of consciousness: awake and alert and oriented Pain management: satisfactory to patient Vital Signs Assessment: post-procedure vital signs reviewed and stable Respiratory status: respiratory function stable and spontaneous breathing Cardiovascular status: blood pressure returned to baseline Postop Assessment: no headache, no backache, spinal receding, patient able to bend at knees and adequate PO intake Anesthetic complications: no    Last Vitals:  Vitals:   04/20/18 0500 04/20/18 0900  BP:  108/70  Pulse:  76  Resp: 20 18  Temp: 36.9 C 36.9 C  SpO2:  100%    Last Pain:  Vitals:   04/20/18 0900  TempSrc: Oral  PainSc: 1    Pain Goal: Patients Stated Pain Goal: 4 (04/20/18 0900)                 Yovani Cogburn

## 2018-04-21 ENCOUNTER — Encounter (HOSPITAL_COMMUNITY): Payer: Self-pay | Admitting: Obstetrics & Gynecology

## 2018-04-21 MED ORDER — PNEUMOCOCCAL VAC POLYVALENT 25 MCG/0.5ML IJ INJ
0.5000 mL | INJECTION | INTRAMUSCULAR | Status: AC
  Administered 2018-04-21: 0.5 mL via INTRAMUSCULAR
  Filled 2018-04-21 (×2): qty 0.5

## 2018-04-21 MED ORDER — OXYCODONE-ACETAMINOPHEN 5-325 MG PO TABS: 2.0000 | ORAL_TABLET | ORAL | 0 refills | Status: DC | PRN

## 2018-04-21 MED ORDER — ACETAMINOPHEN 325 MG PO TABS: 650.0000 mg | ORAL_TABLET | ORAL | 2 refills | Status: DC | PRN

## 2018-04-21 MED ORDER — IBUPROFEN 800 MG PO TABS: 800.0000 mg | ORAL_TABLET | Freq: Four times a day (QID) | ORAL | 0 refills | Status: DC

## 2018-04-21 NOTE — Progress Notes (Signed)
CSW acknowledged consult for edinburgh score 12. CSW completed a psychosocial assessment with MOB on 04/20/18 and provided PMADs education and resources. No barriers to discharge at this time.  Chloeanne Poteet, LCSWA Clinical Social Worker [facility] Cell#: [phone]           

## 2018-04-21 NOTE — Progress Notes (Signed)
Subjective: Postpartum Day 2: Cesarean Delivery Eating, drinking, voiding, ambulating well.  +flatus.  Lochia and pain wnl.  Denies dizziness, lightheadedness, or sob. No complaints. Denies ha, visual changes, ruq/epigastric pain, n/v.   H/O severe pre-e w/ re-admission postpartum, lives in [place] in [place], came here for 2nd opinion, wants to do all her f/u w/ [place], has unreliable transportation  Objective: Vital signs in last 24 hours: Temp:  [97.7 F (36.5 C)-98.4 F (36.9 C)] 98.3 F (36.8 C) (01/29 0551) Pulse Rate:  [77-95] 95 (01/29 0551) Resp:  [18] 18 (01/29 0551) BP: (103-119)/(68-83) 103/83 (01/29 0551) SpO2:  [99 %] 99 % (01/28 1911)  Physical Exam:  General: alert, cooperative and no distress Lochia: appropriate Uterine Fundus: firm Incision: healing well, no significant drainage, no dehiscence, no significant erythema DVT Evaluation: No evidence of DVT seen on physical exam. Negative Homan's sign. No cords or calf tenderness. No significant calf/ankle edema.  Recent Labs    04/19/18 1505 04/20/18 0601  HGB 11.9* 10.3*  HCT 36.5 31.2*    Assessment/Plan: Status post Cesarean section. Doing well postoperatively.  Continue current care. Had discussed d/c today, however w/ h/o severe pre-e w/ readmission and unreliable transportation needing close f/u, will plan to stay one more night  Cheral Marker 04/21/2018, 9:25 AM

## 2018-04-26 ENCOUNTER — Encounter (HOSPITAL_COMMUNITY): Payer: Self-pay | Admitting: *Deleted

## 2018-04-26 ENCOUNTER — Inpatient Hospital Stay (HOSPITAL_COMMUNITY)
Admission: AD | Admit: 2018-04-26 | Discharge: 2018-04-26 | Discharge status: Absent without leave | Payer: Medicaid Other | Source: Home / Self Care | Attending: Obstetrics and Gynecology | Admitting: Obstetrics and Gynecology

## 2018-04-26 ENCOUNTER — Other Ambulatory Visit: Payer: Self-pay

## 2018-04-26 ENCOUNTER — Ambulatory Visit (INDEPENDENT_AMBULATORY_CARE_PROVIDER_SITE_OTHER): Payer: Medicaid Other | Admitting: General Practice

## 2018-04-26 ENCOUNTER — Inpatient Hospital Stay (HOSPITAL_COMMUNITY)
Admission: AD | Admit: 2018-04-26 | Discharge: 2018-04-28 | DRG: 776 | Disposition: A | Discharge status: Home / Self Care | Payer: Medicaid Other | Attending: Obstetrics and Gynecology | Admitting: Obstetrics and Gynecology

## 2018-04-26 VITALS — BP 134/91 | HR 113 | Ht 61.0 in | Wt 126.0 lb

## 2018-04-26 DIAGNOSIS — O1415 Severe pre-eclampsia, complicating the puerperium: Principal | ICD-10-CM

## 2018-04-26 DIAGNOSIS — R51 Headache: Secondary | ICD-10-CM | POA: Diagnosis not present

## 2018-04-26 DIAGNOSIS — O9089 Other complications of the puerperium, not elsewhere classified: Secondary | ICD-10-CM | POA: Diagnosis present

## 2018-04-26 DIAGNOSIS — Z5189 Encounter for other specified aftercare: Secondary | ICD-10-CM

## 2018-04-26 DIAGNOSIS — Z87891 Personal history of nicotine dependence: Secondary | ICD-10-CM | POA: Diagnosis not present

## 2018-04-26 DIAGNOSIS — R21 Rash and other nonspecific skin eruption: Secondary | ICD-10-CM | POA: Diagnosis present

## 2018-04-26 DIAGNOSIS — O9122 Nonpurulent mastitis associated with the puerperium: Secondary | ICD-10-CM | POA: Diagnosis present

## 2018-04-26 DIAGNOSIS — O1495 Unspecified pre-eclampsia, complicating the puerperium: Secondary | ICD-10-CM

## 2018-04-26 LAB — COMPREHENSIVE METABOLIC PANEL
ALT: 16 U/L (ref 0–44)
AST: 15 U/L (ref 15–41)
Albumin: 2.9 g/dL — ABNORMAL LOW (ref 3.5–5.0)
Alkaline Phosphatase: 106 U/L (ref 38–126)
Anion gap: 8 (ref 5–15)
BUN: 8 mg/dL (ref 6–20)
CO2: 25 mmol/L (ref 22–32)
Calcium: 9 mg/dL (ref 8.9–10.3)
Chloride: 104 mmol/L (ref 98–111)
Creatinine, Ser: 0.45 mg/dL (ref 0.44–1.00)
GFR calc Af Amer: >60 mL/min (ref >60)
GFR calc non Af Amer: >60 mL/min (ref >60)
Glucose, Bld: 92 mg/dL (ref 70–99)
Potassium: 4.2 mmol/L (ref 3.5–5.1)
Sodium: 137 mmol/L (ref 135–145)
Total Bilirubin: 0.4 mg/dL (ref 0.3–1.2)
Total Protein: 7.4 g/dL (ref 6.5–8.1)

## 2018-04-26 LAB — CBC
HCT: 34.6 % — ABNORMAL LOW (ref 36.0–46.0)
Hemoglobin: 11.4 g/dL — ABNORMAL LOW (ref 12.0–15.0)
MCH: 29.5 pg (ref 26.0–34.0)
MCHC: 32.9 g/dL (ref 30.0–36.0)
MCV: 89.6 fL (ref 80.0–100.0)
PLATELETS: 341 10*3/uL (ref 150–400)
RBC: 3.86 MIL/uL — ABNORMAL LOW (ref 3.87–5.11)
RDW: 13.7 % (ref 11.5–15.5)
WBC: 8.2 10*3/uL (ref 4.0–10.5)
nRBC: 0 % (ref 0.0–0.2)

## 2018-04-26 LAB — PROTEIN / CREATININE RATIO, URINE
Creatinine, Urine: 69 mg/dL
Protein Creatinine Ratio: 0.39 mg/mg{Cre} — ABNORMAL HIGH (ref 0.00–0.15)
Total Protein, Urine: 27 mg/dL

## 2018-04-26 LAB — RAPID URINE DRUG SCREEN, HOSP PERFORMED
Amphetamines: NOT DETECTED
Barbiturates: NOT DETECTED
Benzodiazepines: NOT DETECTED
Cocaine: NOT DETECTED
Opiates: NOT DETECTED
Tetrahydrocannabinol: NOT DETECTED

## 2018-04-26 LAB — TYPE AND SCREEN
ABO/RH(D): A POS
Antibody Screen: NEGATIVE

## 2018-04-26 MED ORDER — MAGNESIUM SULFATE BOLUS VIA INFUSION: 4.0000 g | Freq: Once | INTRAVENOUS | Status: DC

## 2018-04-26 MED ORDER — LABETALOL HCL 5 MG/ML IV SOLN: 20.0000 mg | INTRAVENOUS | Status: DC | PRN

## 2018-04-26 MED ORDER — VALACYCLOVIR HCL 500 MG PO TABS
1000.0000 mg | ORAL_TABLET | Freq: Two times a day (BID) | ORAL | Status: DC
  Administered 2018-04-26 – 2018-04-28 (×4): 1000 mg via ORAL
  Filled 2018-04-26 (×4): qty 2

## 2018-04-26 MED ORDER — ACETAMINOPHEN 500 MG PO TABS
1000.0000 mg | ORAL_TABLET | Freq: Four times a day (QID) | ORAL | Status: DC | PRN
  Administered 2018-04-26 – 2018-04-28 (×3): 1000 mg via ORAL
  Filled 2018-04-26 (×3): qty 2

## 2018-04-26 MED ORDER — KETOROLAC TROMETHAMINE 30 MG/ML IJ SOLN: 30.0000 mg | Freq: Four times a day (QID) | INTRAMUSCULAR | Status: DC | PRN

## 2018-04-26 MED ORDER — MAGNESIUM SULFATE BOLUS VIA INFUSION
4.0000 g | Freq: Once | INTRAVENOUS | Status: AC
  Administered 2018-04-26: 4 g via INTRAVENOUS
  Filled 2018-04-26: qty 500

## 2018-04-26 MED ORDER — ALBUTEROL SULFATE (2.5 MG/3ML) 0.083% IN NEBU: 3.0000 mL | INHALATION_SOLUTION | Freq: Four times a day (QID) | RESPIRATORY_TRACT | Status: DC | PRN

## 2018-04-26 MED ORDER — NIFEDIPINE ER OSMOTIC RELEASE 30 MG PO TB24: 30.0000 mg | ORAL_TABLET | Freq: Every day | ORAL | Status: DC

## 2018-04-26 MED ORDER — MAGNESIUM SULFATE 40 G IN LACTATED RINGERS - SIMPLE
2.0000 g/h | INTRAVENOUS | Status: AC
  Administered 2018-04-26 – 2018-04-27 (×2): 2 g/h via INTRAVENOUS
  Filled 2018-04-26 (×2): qty 500

## 2018-04-26 MED ORDER — DIPHENHYDRAMINE HCL 25 MG PO CAPS
25.0000 mg | ORAL_CAPSULE | Freq: Every evening | ORAL | Status: DC | PRN
  Administered 2018-04-27: 25 mg via ORAL
  Filled 2018-04-26: qty 1

## 2018-04-26 MED ORDER — HYDRALAZINE HCL 20 MG/ML IJ SOLN: 10.0000 mg | INTRAMUSCULAR | Status: DC | PRN

## 2018-04-26 MED ORDER — FAMOTIDINE 20 MG PO TABS: 20.0000 mg | ORAL_TABLET | Freq: Two times a day (BID) | ORAL | Status: DC | PRN

## 2018-04-26 MED ORDER — LABETALOL HCL 5 MG/ML IV SOLN: 80.0000 mg | INTRAVENOUS | Status: DC | PRN

## 2018-04-26 MED ORDER — ZOLPIDEM TARTRATE 5 MG PO TABS: 5.0000 mg | ORAL_TABLET | Freq: Every evening | ORAL | Status: DC | PRN

## 2018-04-26 MED ORDER — LABETALOL HCL 5 MG/ML IV SOLN: 40.0000 mg | INTRAVENOUS | Status: DC | PRN

## 2018-04-26 MED ORDER — BUTALBITAL-APAP-CAFFEINE 50-325-40 MG PO TABS: 2.0000 | ORAL_TABLET | Freq: Once | ORAL | Status: DC

## 2018-04-26 MED ORDER — HYDRALAZINE HCL 20 MG/ML IJ SOLN: 5.0000 mg | INTRAMUSCULAR | Status: DC | PRN

## 2018-04-26 MED ORDER — POLYETHYLENE GLYCOL 3350 17 G PO PACK
17.0000 g | PACK | Freq: Every day | ORAL | Status: DC
  Administered 2018-04-26 – 2018-04-27 (×2): 17 g via ORAL
  Filled 2018-04-26 (×2): qty 1

## 2018-04-26 MED ORDER — ENOXAPARIN SODIUM 40 MG/0.4ML ~~LOC~~ SOLN
40.0000 mg | SUBCUTANEOUS | Status: DC
  Administered 2018-04-26 – 2018-04-27 (×2): 40 mg via SUBCUTANEOUS
  Filled 2018-04-26 (×2): qty 0.4

## 2018-04-26 MED ORDER — IBUPROFEN 600 MG PO TABS: 600.0000 mg | ORAL_TABLET | Freq: Four times a day (QID) | ORAL | Status: DC | PRN

## 2018-04-26 MED ORDER — NIFEDIPINE ER OSMOTIC RELEASE 30 MG PO TB24
30.0000 mg | ORAL_TABLET | Freq: Every day | ORAL | Status: DC
  Administered 2018-04-26 – 2018-04-28 (×3): 30 mg via ORAL
  Filled 2018-04-26 (×3): qty 1

## 2018-04-26 MED ORDER — OXYCODONE HCL 5 MG PO TABS
10.0000 mg | ORAL_TABLET | Freq: Once | ORAL | Status: DC
  Filled 2018-04-26: qty 2

## 2018-04-26 MED ORDER — DICLOXACILLIN SODIUM 250 MG PO CAPS
500.0000 mg | ORAL_CAPSULE | Freq: Four times a day (QID) | ORAL | Status: DC
  Administered 2018-04-26 – 2018-04-28 (×7): 500 mg via ORAL
  Filled 2018-04-26 (×12): qty 2

## 2018-04-26 MED ORDER — MAGNESIUM SULFATE 40 G IN LACTATED RINGERS - SIMPLE: 2.0000 g/h | INTRAVENOUS | Status: DC

## 2018-04-26 MED ORDER — LACTATED RINGERS IV SOLN
INTRAVENOUS | Status: AC
  Administered 2018-04-26 – 2018-04-27 (×3): via INTRAVENOUS

## 2018-04-26 NOTE — Progress Notes (Signed)
Patient presents to office today for wound check following primary c-section on 1/27. Patient reports headaches, dizziness & blurry vision for last couple of days. Patient is not currently on BP medication and did have Pre-E at end of pregnancy. Discussed with Thressa Sheller who recommends patient have evaluation in MAU given BP & symptoms. Discussed with patient. MAU called & notified. Patient escorted upstairs for further evaluation.  Chase Caller RN BSN 04/26/18

## 2018-04-26 NOTE — Lactation Note (Addendum)
Lactation Consultation Note  Patient Name: Jodi Flores [id number] Date: 04/26/2018   Patient is 1 week postpartum and readmitted to hospital. Patient is pumping for her infant (now w/a CGA of 38 weeks). I attempted to visit patient, but room is completely dark.   Patient noted to be on a number of meds: dicloxacillin 500mg  q6h (L1); enoxaprin (L2); nifedipine XL 30mg  (L2); valacyclovir 1000mg  bid (L2). Patient will start taking gabapentin tomorrow (L2).  Dahlia Client, RN reports patient pumped 2 bottles worth of milk around 1930, which has been taken home to give to infant (who is also formula feeding).   Patient admits to a history of PPMD with last infant. Pt also w/previous diagnosis of bipolar disorder & pt  also disclosed to social worker previous diagnosis of borderline personality disorder. Lurline Hare [facility] 04/26/2018, 9:41 PM

## 2018-04-26 NOTE — MAU Note (Signed)
Pt presents to MAU after her appointment in the clinic for HTN, dizziness, headache. Pt has a rash on her legs and buttocks.

## 2018-04-26 NOTE — Progress Notes (Signed)
I have reviewed the chart and agree with nursing staff's documentation of this patient's encounter.  Thressa Sheller DNP, CNM  04/26/18  4:15 PM

## 2018-04-26 NOTE — MAU Note (Signed)
Sent up from clinic for further eval.  Hx of pre-e; BP elevated today with c/o HA, dizziness and blurred vision.  Pt is not on meds.

## 2018-04-26 NOTE — MAU Provider Note (Signed)
Patient Jodi Flores is a 18 y.o.  [phone] Here 6 days PP after a pLTCs for herpes outbreak. She had her c/section at 37 weeks for gestational hypertension and herpes. She says her outbreak this time wasn't "too bad".  She is exclusively breastfeeding.   In 2018, in her first pregnancy, she was admitted and induced for gestational hypertension. She was discharged home and then re-admitted with pre-eclampsia and given magnesium.    She was discharged home on the 29th; she did not receive blood pressure medicine on discharge. Her plan of care was for her to have a follow-up nurse blood pressure check today and a mood check next week. She was seen in the clinic and then sent up here for evaluation, given her concerning symtptoms of headache, blurry vision and elevated blood pressures.    Patient also reports  A rash that started on the 30th.  The rash is located on her upper thighs. She was told it was from her percocet.  History     CSN: [id number]  Arrival date and time: 04/26/18 1424   First Provider Initiated Contact with Patient 04/26/18 1455      Chief Complaint  Patient presents with  . Headache  . Hypertension  . Blurred Vision   Headache   This is a new problem. The current episode started in the past 7 days. The problem occurs constantly. The pain is located in the frontal region. The quality of the pain is described as aching. The pain is at a severity of 5/10. Associated symptoms include blurred vision. Pertinent negatives include no nausea or vomiting. She has tried acetaminophen (tylenol with codeine didn't help) for the symptoms. Her past medical history is significant for hypertension.  Hypertension  Associated symptoms include blurred vision and headaches.   She also reports blurred vision that is on-going; it started with her headache. She wanted to come here on Saturday when she started having her headache and feeling bad, but her mom refused to take  her.  OB History    Gravida  2   Para  2   Term  2   Preterm      AB      Living  2     SAB      TAB      Ectopic      Multiple  0   Live Births  2           Past Medical History:  Diagnosis Date  . ADHD (attention deficit hyperactivity disorder)   . Anxiety   . Asthma 03/10/2013   Hx of asthma; no recent use of inhaler  . Common migraine with intractable migraine 12/19/2016  . Depression   . Headache   . HSV infection   . Pregnancy induced hypertension   . Suicidal overdose [facility])     Past Surgical History:  Procedure Laterality Date  . CESAREAN SECTION N/A 04/19/2018   Procedure: CESAREAN SECTION;  Surgeon: Tereso Newcomer, MD;  [facility]: [facility];  Service: Obstetrics;  Laterality: N/A;  . NO PAST SURGERIES      Family History  Problem Relation Age of Onset  . Bipolar disorder Mother        with anxiety and depression.  Biological father may also have anxiety.   . Migraines Mother   . Cancer Maternal Grandmother     Social History   Tobacco Use  . Smoking status: Former Smoker    Last attempt  to quit: 08/17/2017    Years since quitting: 0.6  . Smokeless tobacco: Never Used  Substance Use Topics  . Alcohol use: No  . Drug use: No    Allergies:  Allergies  Allergen Reactions  . Fish Allergy Nausea And Vomiting  . Shellfish Allergy Nausea And Vomiting    Medications Prior to Admission  Medication Sig Dispense Refill Last Dose  . acetaminophen (TYLENOL) 325 MG tablet Take 2 tablets (650 mg total) by mouth every 4 (four) hours as needed. 100 tablet 2   . albuterol (PROVENTIL HFA;VENTOLIN HFA) 108 (90 Base) MCG/ACT inhaler Inhale 2 puffs into the lungs every 6 (six) hours as needed for wheezing or shortness of breath.   few months ago  . gabapentin (NEURONTIN) 300 MG capsule Take 1 capsule (300 mg total) by mouth 3 (three) times daily. 90 capsule 6 3-4 weeks ago  . ibuprofen (ADVIL,MOTRIN) 800 MG tablet Take 1 tablet (800 mg  total) by mouth every 6 (six) hours. 30 tablet 0   . oxyCODONE-acetaminophen (PERCOCET/ROXICET) 5-325 MG tablet Take 2 tablets by mouth every 4 (four) hours as needed for up to 5 days for severe pain ((when tolerating fluids)). 30 tablet 0   . Prenat-FeAsp-Meth-FA-DHA w/o A (PRENATE PIXIE) 10-0.6-0.4-200 MG CAPS Take 1 tablet by mouth at bedtime.   04/18/2018 at Unknown time  . valACYclovir (VALTREX) 500 MG tablet Take 500 mg by mouth at bedtime.   04/19/2018 at Unknown time    Review of Systems  HENT: Negative.   Eyes: Positive for blurred vision.  Respiratory: Negative.   Cardiovascular: Negative.   Gastrointestinal: Negative.  Negative for nausea and vomiting.  Genitourinary: Positive for vaginal bleeding.  Skin: Positive for rash.  Neurological: Positive for headaches.  Psychiatric/Behavioral: Negative.    Physical Exam   unknown if currently breastfeeding.  Physical Exam  MAU Course  Procedures  MDM Patient is alert and oriented times 3. IV started; discussed with Dr. Vergie Living and patient to be admitted to 3rd floor for magnesium sulfate.    Assessment and Plan  -Admit to High Risk Ob Unit.   Charlesetta Garibaldi Galena Logie 04/26/2018, 2:58 PM

## 2018-04-26 NOTE — Progress Notes (Signed)
ANTICOAGULATION CONSULT NOTE - Initial Consult  Pharmacy Consult for Lovenox Indication: DVT prophylaxis  Allergies  Allergen Reactions  . Fish Allergy Nausea And Vomiting  . Shellfish Allergy Nausea And Vomiting  . Oxycodone Other (See Comments)    Blisters in mouth    Patient Measurements:   Weight: 57.2kg  Vital Signs: Temp: 98.4 F (36.9 C) (02/03 1549) Temp Source: Oral (02/03 1549) BP: 133/110 (02/03 1549) Pulse Rate: 105 (02/03 1549)  Labs: Recent Labs    04/26/18 1500  CREATININE 0.45    Estimated Creatinine Clearance: 85.4 mL/min (by C-G formula based on SCr of 0.45 mg/dL).   Medical History: Past Medical History:  Diagnosis Date  . ADHD (attention deficit hyperactivity disorder)   . Anxiety   . Asthma 03/10/2013   Hx of asthma; no recent use of inhaler  . Common migraine with intractable migraine 12/19/2016  . Depression   . Headache   . HSV infection   . Pregnancy induced hypertension   . Suicidal overdose (HCC)     Assessment: 17yo pt admitted for headache, blurred vision and increased BP. Pt is s/p 6 days LTCS @ 37 weeks due to herpes outbreak and gestational HTN. Goal of Therapy:  Prophylaxis dosing Monitor platelets by anticoagulation protocol: Yes   Plan:  Lovenox 40mg  sq q24h. No further adjustments necessary.  Claybon Jabs 04/26/2018,4:02 PM

## 2018-04-26 NOTE — H&P (Signed)
Attestation signed by [place] BingPickens, Kamal Jurgens, MD at 04/26/2018 4:54 PM  Attestation of Attending Supervision of Certified Nurse Midwife: Evaluation and management procedures were performed by the CNM under my supervision.  I have seen and examined the patient,  reviewed the CNM's note and chart, and I agree with the management and plan.   HA since leaving the hospital and blurry vision since about yesterday. She is pumping and formula feeding. She states she believes the percocet gave her mouth sores. She states that current HA is different that with her migraine history and she never had blurry vision  Pt declines foley   Patient been on a q6h abx from peds since being dx with mastitis on 1/31. Will restart  NAD C/d/I incision, no e/o infection or separation Normal s1 and s2, no mrgs Ctab No c/c/e  Mg x 24h, start procardia xl 30 qday, will continue abx and do HA regimen safe for breast feeding.   Cornelia Copaharlie Jessic Standifer, Jr MD Attending Center [facility](Faculty Practice)         Show:Clear all [x] Manual[x] Template[] Copied  Added by: [x] Marylene LandKooistra, Kathryn Lorraine, CNM  [] Hover for details Patient Jodi Flores is a 18 y.o.  [phone] Here 6 days PP after a pLTCs for herpes outbreak. She had her c/section at 37 weeks for gestational hypertension and herpes. She says her outbreak this time wasn't "too bad".  She is exclusively breastfeeding.   In 2018, in her first pregnancy, she was admitted and induced for gestational hypertension. She was discharged home and then re-admitted with pre-eclampsia and given magnesium.    She was discharged home on the 29th; she did not receive blood pressure medicine on discharge. Her plan of care was for her to have a follow-up nurse blood pressure check today and a mood check next week. She was seen in the clinic and then sent up here for evaluation, given her concerning symtptoms of headache, blurry vision and  elevated blood pressures.    Patient also reports  A rash that started on the 30th.  The rash is located on her upper thighs. She was told it was from her percocet.  History   CSN: [id number]  Arrival date and time: 04/26/18 1424   First Provider Initiated Contact with Patient 04/26/18 1455         Chief Complaint  Patient presents with  . Headache  . Hypertension  . Blurred Vision   Headache   This is a new problem. The current episode started in the past 7 days. The problem occurs constantly. The pain is located in the frontal region. The quality of the pain is described as aching. The pain is at a severity of 5/10. Associated symptoms include blurred vision. Pertinent negatives include no nausea or vomiting. She has tried acetaminophen (tylenol with codeine didn't help) for the symptoms. Her past medical history is significant for hypertension.  Hypertension  Associated symptoms include blurred vision and headaches.   She also reports blurred vision that is on-going; it started with her headache. She wanted to come here on Saturday when she started having her headache and feeling bad, but her mom refused to take her.          OB History    Gravida  2   Para  2   Term  2   Preterm      AB      Living  2     SAB      TAB  Ectopic      Multiple  0   Live Births  2               Past Medical History:  Diagnosis Date  . ADHD (attention deficit hyperactivity disorder)   . Anxiety   . Asthma 03/10/2013   Hx of asthma; no recent use of inhaler  . Common migraine with intractable migraine 12/19/2016  . Depression   . Headache   . HSV infection   . Pregnancy induced hypertension   . Suicidal overdose [facility](HCC)          Past Surgical History:  Procedure Laterality Date  . CESAREAN SECTION N/A 04/19/2018   Procedure: CESAREAN SECTION;  Surgeon: Tereso NewcomerAnyanwu, Ugonna A, MD;  Location: [facility];  Service: Obstetrics;  Laterality:  N/A;  . NO PAST SURGERIES           Family History  Problem Relation Age of Onset  . Bipolar disorder Mother        with anxiety and depression.  Biological father may also have anxiety.   . Migraines Mother   . Cancer Maternal Grandmother     Social History        Tobacco Use  . Smoking status: Former Smoker    Last attempt to quit: 08/17/2017    Years since quitting: 0.6  . Smokeless tobacco: Never Used  Substance Use Topics  . Alcohol use: No  . Drug use: No    Allergies:      Allergies  Allergen Reactions  . Fish Allergy Nausea And Vomiting  . Shellfish Allergy Nausea And Vomiting           Medications Prior to Admission  Medication Sig Dispense Refill Last Dose  . acetaminophen (TYLENOL) 325 MG tablet Take 2 tablets (650 mg total) by mouth every 4 (four) hours as needed. 100 tablet 2   . albuterol (PROVENTIL HFA;VENTOLIN HFA) 108 (90 Base) MCG/ACT inhaler Inhale 2 puffs into the lungs every 6 (six) hours as needed for wheezing or shortness of breath.   few months ago  . gabapentin (NEURONTIN) 300 MG capsule Take 1 capsule (300 mg total) by mouth 3 (three) times daily. 90 capsule 6 3-4 weeks ago  . ibuprofen (ADVIL,MOTRIN) 800 MG tablet Take 1 tablet (800 mg total) by mouth every 6 (six) hours. 30 tablet 0   . oxyCODONE-acetaminophen (PERCOCET/ROXICET) 5-325 MG tablet Take 2 tablets by mouth every 4 (four) hours as needed for up to 5 days for severe pain ((when tolerating fluids)). 30 tablet 0   . Prenat-FeAsp-Meth-FA-DHA w/o A (PRENATE PIXIE) 10-0.6-0.4-200 MG CAPS Take 1 tablet by mouth at bedtime.   04/18/2018 at Unknown time  . valACYclovir (VALTREX) 500 MG tablet Take 500 mg by mouth at bedtime.   04/19/2018 at Unknown time    Review of Systems  HENT: Negative.   Eyes: Positive for blurred vision.  Respiratory: Negative.   Cardiovascular: Negative.   Gastrointestinal: Negative.  Negative for nausea and vomiting.  Genitourinary:  Positive for vaginal bleeding.  Skin: Positive for rash.  Neurological: Positive for headaches.  Psychiatric/Behavioral: Negative.    Physical Exam   unknown if currently breastfeeding.  Physical Exam  MAU Course  Procedures  MDM Patient is alert and oriented times 3. IV started; discussed with Dr. Vergie LivingPickens and patient to be admitted to 3rd floor for magnesium sulfate.    Assessment and Plan  -Admit to High Risk Ob Unit.   Charlesetta GaribaldiKathryn Lorraine Kooistra 04/26/2018, 2:58  PM         Cosigned by: [place] Bing, MD at 04/26/2018 4:54 PM  Revision History

## 2018-04-27 LAB — COMPREHENSIVE METABOLIC PANEL
ALT: 14 U/L (ref 0–44)
ANION GAP: 9 (ref 5–15)
AST: 13 U/L — ABNORMAL LOW (ref 15–41)
Albumin: 2.6 g/dL — ABNORMAL LOW (ref 3.5–5.0)
Alkaline Phosphatase: 88 U/L (ref 38–126)
BUN: 9 mg/dL (ref 6–20)
CO2: 24 mmol/L (ref 22–32)
Calcium: 7 mg/dL — ABNORMAL LOW (ref 8.9–10.3)
Chloride: 103 mmol/L (ref 98–111)
Creatinine, Ser: 0.48 mg/dL (ref 0.44–1.00)
GFR calc Af Amer: >60 mL/min (ref >60)
GFR calc non Af Amer: >60 mL/min (ref >60)
GLUCOSE: 95 mg/dL (ref 70–99)
Potassium: 3.5 mmol/L (ref 3.5–5.1)
Sodium: 136 mmol/L (ref 135–145)
TOTAL PROTEIN: 7.1 g/dL (ref 6.5–8.1)
Total Bilirubin: 0.3 mg/dL (ref 0.3–1.2)

## 2018-04-27 LAB — CBC
HCT: 32.5 % — ABNORMAL LOW (ref 36.0–46.0)
Hemoglobin: 10.6 g/dL — ABNORMAL LOW (ref 12.0–15.0)
MCH: 28.7 pg (ref 26.0–34.0)
MCHC: 32.6 g/dL (ref 30.0–36.0)
MCV: 88.1 fL (ref 80.0–100.0)
Platelets: 334 10*3/uL (ref 150–400)
RBC: 3.69 MIL/uL — ABNORMAL LOW (ref 3.87–5.11)
RDW: 13.6 % (ref 11.5–15.5)
WBC: 7 10*3/uL (ref 4.0–10.5)
nRBC: 0 % (ref 0.0–0.2)

## 2018-04-27 NOTE — Lactation Note (Signed)
Lactation Consultation Note: Mom has been readmitted to hospital due to headache and high BP. Reports pumping is going well and breasts feel comfortable. No questions at present,   Patient Name: Jodi Flores Today's Date: 04/27/2018     Maternal Data    Feeding    LATCH Score                   Interventions    Lactation Tools Discussed/Used     Consult Status      Pamelia HoitWeeks, Tayden Nichelson D 04/27/2018, 9:31 AM

## 2018-04-27 NOTE — Progress Notes (Signed)
Daily Postpartum Note  Admission Date: 04/26/2018 Current Date: 04/27/2018 9:51 AM  Jodi Flores is a 18 y.o. G2P2002, POD#8/HD#2, admitted for pp pre-eclampsia with severe features (BP, HA, visual s/s).  Pregnancy complicated by: Patient Active Problem List   Diagnosis Date Noted  . Pre-eclampsia in postpartum period 04/26/2018  . Severe pre-eclampsia, postpartum 04/26/2018  . Preeclampsia, 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  . 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 History of suicide attempt 04/19/2018  . Lithium use 04/19/2018  . S/P cesarean section 04/19/2018  . Common migraine with intractable migraine 12/19/2016  . Bipolar disorder (HCC) 03/10/2013  . Oppositional defiant disorder 03/10/2013  . ADHD (attention deficit hyperactivity disorder), combined type 03/10/2013    Overnight/24hr events:  none  Subjective:  No s/s of pre-eclampsia.   Objective:    Current Vital Signs 24h Vital Sign Ranges  T 98.3 F (36.8 C) Temp  Avg: 98.2 F (36.8 C)  Min: 97.5 F (36.4 C)  Max: 98.6 F (37 C)  BP 103/72 BP  Min: 90/57  Max: 145/104  HR (!) 104 Pulse  Avg: 104.1  Min: 79  Max: 123  RR 18 Resp  Avg: 18.5  Min: 17  Max: 20  SaO2 100 % Room Air SpO2  Avg: 99.8 %  Min: 98 %  Max: 100 %       24 Hour I/O Current Shift I/O  Time Ins Outs 02/03 0701 - 02/04 0700 In: 2798.9 [P.O.:1440; I.V.:1358.9] Out: 2800 [Urine:2800] 02/04 0701 - 02/04 1900 In: 240 [P.O.:240] Out: 800 [Urine:800]   Patient Vitals for the past 24 hrs:  BP Temp Temp src Pulse Resp SpO2 Height Weight  04/27/18 0843 103/72 98.3 F (36.8 C) Oral (!) 104 18 100 % - -  04/27/18 0341 101/66 98.3 F (36.8 C) Oral 88 17 98 % - -  04/27/18 0029 109/80 - - 96 18 100 % - -  04/26/18 2331 103/65 98 F (36.7 C) Oral 92 18 100 % - -  04/26/18 2230 (!) 90/57 - - 79 18 100 % - -  04/26/18 2133 113/81 - - 94 20 - - -  04/26/18 2030 115/77 - - (!) 109 18 100 % - -  04/26/18  1932 126/89 (!) 97.5 F (36.4 C) Oral (!) 120 20 100 % 5\' 1"  (1.549 m) 57.2 kg  04/26/18 1830 123/84 98 F (36.7 C) Oral 100 18 100 % - -  04/26/18 1728 129/86 98.6 F (37 C) Oral 89 18 100 % - -  04/26/18 1638 122/79 - - (!) 111 - 100 % - -  04/26/18 1627 121/84 - - 99 - 100 % - -  04/26/18 1621 (!) 141/93 - - (!) 112 - 100 % - -  04/26/18 1549 (!) 133/110 98.4 F (36.9 C) Oral (!) 105 20 100 % - -  04/26/18 1516 (!) 145/104 - - (!) 119 - - - -  04/26/18 1501 140/85 - - (!) 123 - - - -  04/26/18 1447 (!) 143/105 - - (!) 112 - - - -  04/26/18 1433 (!) 145/97 - - (!) 112 - - - -    Physical exam: General: Well nourished, well developed female in no acute distress. Abdomen: nd, soft, +BS, c/d/i incision Cardiovascular: S1, S2 normal, no murmur, rub or gallop, regular rate and rhythm Respiratory: CTAB Extremities: no clubbing, cyanosis or edema Skin: Warm and dry.   Medications: Current Facility-Administered Medications  Medication Dose Route Frequency  Provider Last Rate Last Dose  . acetaminophen (TYLENOL) tablet 1,000 mg  1,000 mg Oral Q6H PRN [place] Bing, MD   1,000 mg at 04/26/18 1714  . albuterol (PROVENTIL) (2.5 MG/3ML) 0.083% nebulizer solution 3 mL  3 mL Inhalation Q6H PRN [place] Bing, MD      . dicloxacillin (DYNAPEN) capsule 500 mg  500 mg Oral Q6H [place] Bing, MD   500 mg at 04/27/18 0544  . diphenhydrAMINE (BENADRYL) capsule 25 mg  25 mg Oral QHS PRN [place] Bing, MD      . enoxaparin (LOVENOX) injection 40 mg  40 mg Subcutaneous Q24H [place] Bing, MD   40 mg at 04/26/18 2128  . famotidine (PEPCID) tablet 20 mg  20 mg Oral BID PRN [place] Bing, MD      . labetalol (NORMODYNE,TRANDATE) injection 20 mg  20 mg Intravenous PRN [place] Bing, MD       And  . labetalol (NORMODYNE,TRANDATE) injection 40 mg  40 mg Intravenous PRN [place] Bing, MD       And  . labetalol (NORMODYNE,TRANDATE) injection 80 mg  80 mg Intravenous PRN [place] Bing, MD       And  . hydrALAZINE (APRESOLINE) injection 10 mg  10 mg Intravenous PRN [place] Bing, MD      . ibuprofen (ADVIL,MOTRIN) tablet 600 mg  600 mg Oral Q6H PRN [place] Bing, MD      . ketorolac (TORADOL) 30 MG/ML injection 30 mg  30 mg Intravenous Q6H PRN [place] Bing, MD      . lactated ringers infusion   Intravenous Continuous [place] Bing, MD 75 mL/hr at 04/26/18 2228    . magnesium sulfate 40 grams in LR 500 mL OB infusion  2 g/hr Intravenous Continuous [place] Bing, MD 25 mL/hr at 04/26/18 1641 2 g/hr at 04/26/18 1641  . NIFEdipine (PROCARDIA-XL/NIFEDICAL-XL) 24 hr tablet 30 mg  30 mg Oral Daily [place] Bing, MD   30 mg at 04/26/18 1618  . oxyCODONE (Oxy IR/ROXICODONE) immediate release tablet 10 mg  10 mg Oral Once [place] Bing, MD   Stopped at 04/26/18 1754  . polyethylene glycol (MIRALAX / GLYCOLAX) packet 17 g  17 g Oral Daily [place] Bing, MD   17 g at 04/26/18 2128  . valACYclovir (VALTREX) tablet 1,000 mg  1,000 mg Oral BID [place] Bing, MD   1,000 mg at 04/26/18 2128  . zolpidem (AMBIEN) tablet 5 mg  5 mg Oral QHS PRN Hermina Staggers, MD        Labs:  Recent Labs  Lab 04/26/18 1500 04/27/18 0536  WBC 8.2 7.0  HGB 11.4* 10.6*  HCT 34.6* 32.5*  PLT 341 334    Recent Labs  Lab 04/26/18 1500 04/27/18 0536  NA 137 136  K 4.2 3.5  CL 104 103  CO2 25 24  BUN 8 9  CREATININE 0.45 0.48  CALCIUM 9.0 7.0*  PROT 7.4 7.1  BILITOT 0.4 0.3  ALKPHOS 106 88  ALT 16 14  AST 15 13*  GLUCOSE 92 95     Radiology: no new imaging  Assessment & Plan:  Pt doing well *Post op: pumping. Doing well *PP severe pre-x: continue Mg. Continue procardia.  *PPx: lovenox *FEN/GI:regular diet, MIVF *Dispo: likely tomorrow  Cornelia Copa. MD Attending Center for [facility])

## 2018-04-28 MED ORDER — DICLOXACILLIN SODIUM 500 MG PO CAPS: 500.0000 mg | ORAL_CAPSULE | Freq: Four times a day (QID) | ORAL | Status: DC

## 2018-04-28 MED ORDER — NIFEDIPINE ER 30 MG PO TB24: 30.0000 mg | ORAL_TABLET | Freq: Every day | ORAL | 0 refills | Status: DC

## 2018-04-28 NOTE — Discharge Instructions (Signed)
Hypertension During Pregnancy  Hypertension is also called high blood pressure. High blood pressure means that the force of your blood moving in your body is too strong. When you are pregnant, this condition should be watched carefully. It can cause problems for you and your baby. Follow these instructions at home: Eating and drinking   Drink enough fluid to keep your pee (urine) pale yellow.  Avoid caffeine. Lifestyle  Do not use any products that contain nicotine or tobacco, such as cigarettes and e-cigarettes. If you need help quitting, ask your doctor.  Do not use alcohol or drugs.  Avoid stress.  Rest and get plenty of sleep. General instructions  Take over-the-counter and prescription medicines only as told by your doctor.  While lying down, lie on your left side. This keeps pressure off your major blood vessels.  While sitting or lying down, raise (elevate) your feet. Try putting some pillows under your lower legs.  Exercise regularly. Ask your doctor what kinds of exercise are best for you.  Keep all prenatal and follow-up visits as told by your doctor. This is important. Contact a doctor if:  You have symptoms that your doctor told you to watch for, such as: ? Throwing up (vomiting). ? Feeling sick to your stomach (nausea). ? Headache. Get help right away if you have:  Very bad belly pain that does not get better with treatment.  A very bad headache that does not get better.  Throwing up that does not get better with treatment.  Sudden, fast weight gain.  Sudden swelling in your hands, ankles, or face.  Blood in your pee.  Fewer movements from your baby than usual.  Blurry vision.  Double vision.  Muscle twitching.  Sudden muscle tightening (spasms).  Trouble breathing.  Blue fingernails or lips. Summary  Hypertension is also called high blood pressure. High blood pressure means that the force of your blood moving in your body is too  strong.  When you are pregnant, this condition should be watched carefully. It can cause problems for you and your baby.  Get help right away if you have symptoms that your doctor told you to watch for. This information is not intended to replace advice given to you by your health care provider. Make sure you discuss any questions you have with your health care provider. Document Released: 04/12/2010 Document Revised: 02/24/2017 Document Reviewed: 11/20/2015 Elsevier Interactive Patient Education  2019 [employer].

## 2018-04-28 NOTE — Discharge Summary (Addendum)
Discharge Summary   Admit Date: 04/26/2018 Discharge Date: 04/28/2018 Discharging Service: Obstetrics  [facility] Admitting Physician: [place] Bingharlie Lillyn Wieczorek, Flores  Discharge Physician: Jodi LivingPickens  [facility]  [facility] Care Provider: [facility]  Admission Diagnoses: *POD#7 s/p c-section *PP pre-eclampsia with severe features (BP and neuro s/s)  Discharge Diagnoses: *POD#9 s/p c-section *resolved pre-eclampsia  Consult Orders: ENOXAPARIN (LOVENOX) PER PHARMACY CONSULT   Surgeries/Procedures Performed: None  History and Physical: Attestation signed by [place] BingPickens, Jodi Ruffino, Flores at 04/26/2018 4:54 PM  Attestation of Attending Supervision of Certified Nurse Midwife:Evaluation and management procedures were performed by the CNM under my supervision. I have seen and examined the patient, reviewed the CNM's note and chart, and I agree with the management and plan.   HA since leaving the hospital and blurry vision since about yesterday. She is pumping and formula feeding. She states she believes the percocet gave her mouth sores. She states that current HA is different that with her migraine history and she never had blurry vision  Pt declines foley   Patient been on a q6h abx from peds since being dx with mastitis on 1/31. Will restart  NAD C/d/I incision, no e/o infection or separation Normal s1 and s2, no mrgs Ctab No c/c/e  Mg x 24h, start procardia xl 30 qday, will continue abx and do HA regimen safe for breast feeding.   Jodi Flores, Jr Flores Attending Center for [facility] Practice)         Show:Clear all [x] ?Manual[x] ?Template[] ?Copied  Added by: [x] ?Jodi LandKooistra, Jodi Flores, CNM  [] ?Hover for details PatientFaith Gar Gibbonnne Marie Flores a17 y.o.G2P2002 Here 6 days PP after a pLTCs for herpes outbreak. She had her c/section at 37 weeks for  gestational hypertension and herpes. She says her outbreak this time wasn't "too bad".  She is exclusively breastfeeding.   In 2018, in her first pregnancy, she was admitted and induced for gestational hypertension. She was discharged home and then re-admitted with pre-eclampsia and given magnesium.    She was discharged home on the 29th; she did not receive blood pressure medicine on discharge. Her plan of care was for her to have a follow-up nurse blood pressure check today and a mood check next week. She was seen in the clinic and then sent up here for evaluation, given her concerning symtptoms of headache, blurry vision and elevated blood pressures.    Patient also reports A rash that started on the 30th. The rash is located on her upper thighs. She was told it was from her percocet.  History   ZOX:[id number]  Arrival date and time:2/3/201424  First Provider Initiated Contact with Patient 04/26/18 1455       Chief Complaint  Patient presents with  . Headache  . Hypertension  . Blurred Vision   Headache This is anewproblem. The current episode startedin the past 7 days. The problem occursconstantly. The pain is located in thefrontalregion. The quality of the pain is described asaching. The pain is at a severity of5/10. Associated symptoms includeblurred vision. Pertinent negatives include nonauseaor vomiting. She has triedacetaminophen (tylenol with codeine didn't help)for the symptoms. Her past medical history is significant forhypertension. Hypertension Associated symptoms includeblurred visionand headaches.  She also reports blurred vision that is on-going; it started with her headache. She wanted to come here on Saturday when she started having her headache and feeling bad, but her mom refused to take her.  OB History   Gravida  2   Para  2   Term  2   Preterm     AB     Living  2     SAB       TAB     Ectopic     Multiple  0   Live Births  2              Past Medical History:  Diagnosis Date  . ADHD (attention deficit hyperactivity disorder)   . Anxiety   . Asthma 03/10/2013   Hx of asthma; no recent use of inhaler  . Common migraine with intractable migraine 12/19/2016  . Depression   . Headache   . HSV infection   . Pregnancy induced hypertension   . Suicidal overdose [facility](HCC)          Past Surgical History:  Procedure Laterality Date  . CESAREAN SECTION N/A 04/19/2018   Procedure: CESAREAN SECTION; Surgeon: Jodi NewcomerAnyanwu, Jodi Flores; Location: [facility]; Service: Obstetrics; Laterality: N/A;  . NO PAST SURGERIES           Family History  Problem Relation Age of Onset  . Bipolar disorder Mother    with anxiety and depression. Biological father may also have anxiety.   . Migraines Mother   . Cancer Maternal Grandmother     Social History        Tobacco Use  . Smoking status: Former Smoker    Last attempt to quit: 08/17/2017    Years since quitting: 0.6  . Smokeless tobacco: Never Used  Substance Use Topics  . Alcohol use: No  . Drug use: No    Allergies:     Allergies  Allergen Reactions  . Fish Allergy Nausea And Vomiting  . Shellfish Allergy Nausea And Vomiting           Medications Prior to Admission  Medication Sig Dispense Refill Last Dose  . acetaminophen (TYLENOL) 325 MG tablet Take 2 tablets (650 mg total) by mouth every 4 (four) hours as needed. 100 tablet 2   . albuterol (PROVENTIL HFA;VENTOLIN HFA) 108 (90 Base) MCG/ACT inhaler Inhale 2 puffs into the lungs every 6 (six) hours as needed for wheezing or shortness of breath.   few months ago  . gabapentin (NEURONTIN) 300 MG capsule Take 1 capsule (300 mg total) by mouth 3 (three) times daily. 90 capsule 6 3-4 weeks ago  . ibuprofen (ADVIL,MOTRIN) 800 MG tablet Take 1 tablet (800 mg total) by  mouth every 6 (six) hours. 30 tablet 0   . oxyCODONE-acetaminophen (PERCOCET/ROXICET) 5-325 MG tablet Take 2 tablets by mouth every 4 (four) hours as needed for up to 5 days for severe pain ((when tolerating fluids)). 30 tablet 0   . Prenat-FeAsp-Meth-FA-DHA w/o A (PRENATE PIXIE) 10-0.6-0.4-200 MG CAPS Take 1 tablet by mouth at bedtime.   04/18/2018 at Unknown time  . valACYclovir (VALTREX) 500 MG tablet Take 500 mg by mouth at bedtime.   04/19/2018 at Unknown time    Review of Systems  HENT:Negative.  Eyes: Positive forblurred vision.  Respiratory:Negative.  Cardiovascular:Negative.  Gastrointestinal:Negative. Negative for nauseaand vomiting.  Genitourinary: Positive forvaginal bleeding.  Skin: Positive forrash.  Neurological: Positive forheadaches.  Psychiatric/Behavioral:Negative.  Physical Exam   unknown if currently breastfeeding.  Physical Exam  MAU Course  Procedures  MDM Patient is alert and oriented times 3. IV started;discussed with Dr. Vergie LivingPickens and patient to be admitted to 3rd floor for magnesium sulfate.  Assessment and Plan  -Admit to High Risk Ob Unit.  Charlesetta Garibaldi Flores 04/26/2018,2:58 PM        Cosigned by: [place] Bing, Flores at 04/26/2018 4:54 PM   Revision History            Electronically signed by [place] Bing, Flores at 04/26/2018 5:01 PM    Hospital Course: Patient received magnesium x 24 hours and was started on procardia xl 30mg  qday. By the day of discharge, her ROS was negative and she was discharged to home on HD#3.   Discharge Exam:    Patient Vitals for the past 24 hrs:  BP Temp Temp src Pulse Resp SpO2  04/28/18 0329 (!) 120/91 98.4 F (36.9 C) Oral 87 17 100 %  04/27/18 2328 (!) 99/59 98.6 F (37 C) Oral 80 18 94 %  04/27/18 1942 (!) 123/97 98.2 F (36.8 C) Oral (!) 112 18 100 %  04/27/18 1545 122/84 98.2 F (36.8 C) Oral (!) 102 18 100 %  04/27/18 1500 - - - - 18 -  04/27/18  1400 - - - - 18 -  04/27/18 1300 - - - - 17 -  04/27/18 1209 119/77 97.9 F (36.6 C) Oral 100 18 100 %  04/27/18 1000 - - - - 18 -    General appearance: Well nourished, well developed female in no acute distress.  Cardiovascular: S1, S2 normal, no murmur, rub or gallop, regular rate and rhythm Respiratory:  Clear to auscultation bilateral. Normal respiratory effort Abdomen: positive bowel sounds and no masses, hernias; diffusely non tender to palpation, non distended. C/d/i incision Neuro/Psych:  Normal mood and affect.  Skin:  Warm and dry.   CBC Latest Ref Rng & Units 04/27/2018 04/26/2018 04/20/2018  WBC 4.0 - 10.5 K/uL 7.0 8.2 13.0(H)  Hemoglobin 12.0 - 15.0 g/dL 10.6(L) 11.4(L) 10.3(L)  Hematocrit 36.0 - 46.0 % 32.5(L) 34.6(L) 31.2(L)  Platelets 150 - 400 K/uL 334 341 188   CMP Latest Ref Rng & Units 04/27/2018 04/26/2018 04/20/2018  Glucose 70 - 99 mg/dL 95 92 -  BUN 6 - 20 mg/dL 9 8 -  Creatinine 4.81 - 1.00 mg/dL 8.56 3.14 9.70  Sodium 135 - 145 mmol/L 136 137 -  Potassium 3.5 - 5.1 mmol/L 3.5 4.2 -  Chloride 98 - 111 mmol/L 103 104 -  CO2 22 - 32 mmol/L 24 25 -  Calcium 8.9 - 10.3 mg/dL 7.0(L) 9.0 -  Total Protein 6.5 - 8.1 g/dL 7.1 7.4 -  Total Bilirubin 0.3 - 1.2 mg/dL 0.3 0.4 -  Alkaline Phos 38 - 126 U/L 88 106 -  AST 15 - 41 U/L 13(L) 15 -  ALT 0 - 44 U/L 14 16 -    Discharge Disposition:  Home  Patient Instructions:  Standard   Results Pending at Discharge:  none  Discharge Medications: Allergies as of 04/28/2018      Reactions   Fish Allergy Nausea And Vomiting   Shellfish Allergy Nausea And Vomiting   Oxycodone Other (See Comments)   Blisters in mouth      Medication List    STOP taking these medications   oxyCODONE-acetaminophen 5-325 MG tablet Commonly known as:  PERCOCET/ROXICET     TAKE these medications   acetaminophen 325 MG tablet Commonly known as:  TYLENOL Take 2 tablets (650 mg total) by mouth every 4 (four) hours as needed.    acetaminophen-codeine 300-30 MG tablet Commonly known as:  TYLENOL #3 Take 2 tablets by mouth daily  as needed for moderate pain.   albuterol 108 (90 Base) MCG/ACT inhaler Commonly known as:  PROVENTIL HFA;VENTOLIN HFA Inhale 2 puffs into the lungs every 6 (six) hours as needed for wheezing or shortness of breath.   dicloxacillin 500 MG capsule Commonly known as:  DYNAPEN Take 1 capsule (500 mg total) by mouth every 6 (six) hours.   gabapentin 600 MG tablet Commonly known as:  NEURONTIN Take 600 mg by mouth at bedtime. What changed:  Another medication with the same name was removed. Continue taking this medication, and follow the directions you see here.   ibuprofen 800 MG tablet Commonly known as:  ADVIL,MOTRIN Take 1 tablet (800 mg total) by mouth every 6 (six) hours.   NIFEdipine 30 MG 24 hr tablet Commonly known as:  ADALAT CC Take 1 tablet (30 mg total) by mouth daily.   PRENATE PIXIE 10-0.6-0.4-200 MG Caps Take 1 tablet by mouth at bedtime.   valACYclovir 500 MG tablet Commonly known as:  VALTREX Take 500 mg by mouth 2 (two) times daily.        Follow up: I will set up 1wk f/u with our office. Pt states may follow up with her primary OB instead.   Jodi Copa Flores Attending Center for [facility])

## 2018-05-04 ENCOUNTER — Telehealth: Payer: Self-pay | Admitting: Licensed Clinical Social Worker

## 2018-05-04 NOTE — Telephone Encounter (Signed)
Spoke to pt to remind of appt 2/12.  °

## 2018-05-05 ENCOUNTER — Ambulatory Visit: Payer: Medicaid Other

## 2018-06-14 ENCOUNTER — Ambulatory Visit: Payer: Self-pay

## 2018-06-14 NOTE — Lactation Note (Signed)
This note was copied from a baby's chart. Lactation Consultation Note  Patient Name: Enid Skeens [id number] Date: 06/14/2018     Maternal Data    Feeding    LATCH Score                   Interventions    Lactation Tools Discussed/Used     Consult Status      Ed Blalock 06/14/2018, 12:50 PM

## 2018-07-09 ENCOUNTER — Emergency Department (HOSPITAL_COMMUNITY)
Admission: EM | Admit: 2018-07-09 | Discharge: 2018-07-10 | Disposition: A | Discharge status: Home / Self Care | Payer: Medicaid Other | Attending: Emergency Medicine | Admitting: Emergency Medicine

## 2018-07-09 ENCOUNTER — Encounter (HOSPITAL_COMMUNITY): Payer: Self-pay | Admitting: Emergency Medicine

## 2018-07-09 ENCOUNTER — Other Ambulatory Visit: Payer: Self-pay

## 2018-07-09 DIAGNOSIS — Z79899 Other long term (current) drug therapy: Secondary | ICD-10-CM | POA: Insufficient documentation

## 2018-07-09 DIAGNOSIS — R112 Nausea with vomiting, unspecified: Secondary | ICD-10-CM | POA: Insufficient documentation

## 2018-07-09 DIAGNOSIS — R102 Pelvic and perineal pain: Secondary | ICD-10-CM | POA: Insufficient documentation

## 2018-07-09 DIAGNOSIS — I10 Essential (primary) hypertension: Secondary | ICD-10-CM | POA: Insufficient documentation

## 2018-07-09 DIAGNOSIS — R10817 Generalized abdominal tenderness: Secondary | ICD-10-CM | POA: Insufficient documentation

## 2018-07-09 DIAGNOSIS — N939 Abnormal uterine and vaginal bleeding, unspecified: Secondary | ICD-10-CM | POA: Insufficient documentation

## 2018-07-09 DIAGNOSIS — F909 Attention-deficit hyperactivity disorder, unspecified type: Secondary | ICD-10-CM | POA: Diagnosis not present

## 2018-07-09 DIAGNOSIS — Z87891 Personal history of nicotine dependence: Secondary | ICD-10-CM | POA: Insufficient documentation

## 2018-07-09 LAB — CBC WITH DIFFERENTIAL/PLATELET
Abs Immature Granulocytes: 0.02 10*3/uL (ref 0.00–0.07)
Basophils Absolute: 0.1 10*3/uL (ref 0.0–0.1)
Basophils Relative: 1 %
Eosinophils Absolute: 0.1 10*3/uL (ref 0.0–0.5)
Eosinophils Relative: 2 %
HCT: 41.1 % (ref 36.0–46.0)
Hemoglobin: 12.4 g/dL (ref 12.0–15.0)
Immature Granulocytes: 0 %
Lymphocytes Relative: 31 %
Lymphs Abs: 1.9 10*3/uL (ref 0.7–4.0)
MCH: 25.7 pg — ABNORMAL LOW (ref 26.0–34.0)
MCHC: 30.2 g/dL (ref 30.0–36.0)
MCV: 85.1 fL (ref 80.0–100.0)
Monocytes Absolute: 0.5 10*3/uL (ref 0.1–1.0)
Monocytes Relative: 8 %
Neutro Abs: 3.6 10*3/uL (ref 1.7–7.7)
Neutrophils Relative %: 58 %
Platelets: 267 10*3/uL (ref 150–400)
RBC: 4.83 MIL/uL (ref 3.87–5.11)
RDW: 13.2 % (ref 11.5–15.5)
WBC: 6.2 10*3/uL (ref 4.0–10.5)
nRBC: 0 % (ref 0.0–0.2)

## 2018-07-09 LAB — URINALYSIS, ROUTINE W REFLEX MICROSCOPIC
Bilirubin Urine: NEGATIVE
Glucose, UA: NEGATIVE mg/dL
Hgb urine dipstick: NEGATIVE
Ketones, ur: NEGATIVE mg/dL
Leukocytes,Ua: NEGATIVE
Nitrite: NEGATIVE
Protein, ur: NEGATIVE mg/dL
Specific Gravity, Urine: 1.003 — ABNORMAL LOW (ref 1.005–1.030)
pH: 7 (ref 5.0–8.0)

## 2018-07-09 LAB — COMPREHENSIVE METABOLIC PANEL
ALT: 14 U/L (ref 0–44)
AST: 16 U/L (ref 15–41)
Albumin: 4.1 g/dL (ref 3.5–5.0)
Alkaline Phosphatase: 72 U/L (ref 38–126)
Anion gap: 8 (ref 5–15)
BUN: 7 mg/dL (ref 6–20)
CO2: 23 mmol/L (ref 22–32)
Calcium: 9.4 mg/dL (ref 8.9–10.3)
Chloride: 109 mmol/L (ref 98–111)
Creatinine, Ser: 0.78 mg/dL (ref 0.44–1.00)
GFR calc Af Amer: >60 mL/min (ref >60)
GFR calc non Af Amer: >60 mL/min (ref >60)
Glucose, Bld: 85 mg/dL (ref 70–99)
Potassium: 3.6 mmol/L (ref 3.5–5.1)
Sodium: 140 mmol/L (ref 135–145)
Total Bilirubin: 0.3 mg/dL (ref 0.3–1.2)
Total Protein: 7.5 g/dL (ref 6.5–8.1)

## 2018-07-09 LAB — I-STAT BETA HCG BLOOD, ED (MC, WL, AP ONLY): I-stat hCG, quantitative: <5 m[IU]/mL (ref <5)

## 2018-07-09 NOTE — ED Triage Notes (Signed)
Pt reports vaginal bleeding (clotts) since giving birth in January.  Pt has had hypertension and headache since.

## 2018-07-10 ENCOUNTER — Emergency Department (HOSPITAL_COMMUNITY): Payer: Medicaid Other

## 2018-07-10 LAB — WET PREP, GENITAL
Clue Cells Wet Prep HPF POC: NONE SEEN
Sperm: NONE SEEN
Trich, Wet Prep: NONE SEEN
Yeast Wet Prep HPF POC: NONE SEEN

## 2018-07-10 NOTE — ED Provider Notes (Signed)
[facility]   CSN: [id number] Arrival date & time: 07/09/18  2214    History   Chief Complaint Chief Complaint  Patient presents with  . Vaginal Bleeding  . Hypertension    HPI Jodi Flores is a [age] y.o. female.     Patient to ED with complaint of pain across lower abdomen, L>R, for th epast one week. Seen by her primary care physician at the onset and given Naproxen which has not been helping. She developed nausea with vomiting 5 days ago and is taking Zofran with relief. No fever at any time. She also reports intermittent vaginal bleeding with varying flow since giving birth in January, approximately 12 weeks ago. She also reports a white vaginal discharge since yesterday. No bleeding currently. No urinary symptoms.   The history is provided by the patient. No language interpreter was used.  Vaginal Bleeding  Associated symptoms: abdominal pain, nausea and vaginal discharge   Associated symptoms: no dysuria and no fever   Hypertension  Associated symptoms include abdominal pain. Pertinent negatives include no shortness of breath.    Past Medical History:  Diagnosis Date  . ADHD (attention deficit hyperactivity disorder)   . Anxiety   . Asthma 03/10/2013   Hx of asthma; no recent use of inhaler  . Common migraine with intractable migraine 12/19/2016  . Depression   . Headache   . HSV infection   . Pregnancy induced hypertension   . Suicidal overdose [facility](HCC)     Patient Active Problem List   Diagnosis Date Noted  . Pre-eclampsia in postpartum period 04/26/2018  . Severe pre-eclampsia, postpartum 04/26/2018  . Preeclampsia, 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  . 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 History of suicide attempt 04/19/2018  . Lithium use 04/19/2018  . S/P cesarean section 04/19/2018  . Common migraine with intractable migraine 12/19/2016  . Bipolar disorder  (HCC) 03/10/2013  . Oppositional defiant disorder 03/10/2013  . ADHD (attention deficit hyperactivity disorder), combined type 03/10/2013    Past Surgical History:  Procedure Laterality Date  . CESAREAN SECTION N/A 04/19/2018   Procedure: CESAREAN SECTION;  Surgeon: Tereso NewcomerAnyanwu, Ugonna A, MD;  [facility];  Service: Obstetrics;  Laterality: N/A;  . NO PAST SURGERIES       OB History    Gravida  2   Para  2   Term  2   Preterm      AB      Living  2     SAB      TAB      Ectopic      Multiple  0   Live Births  2            Home Medications    Prior to Admission medications   Medication Sig Start Date End Date Taking? Authorizing Provider  acetaminophen (TYLENOL) 325 MG tablet Take 2 tablets (650 mg total) by mouth every 4 (four) hours as needed. Patient not taking: Reported on 04/26/2018 04/21/18 04/21/19  Calvert CantorWeinhold, Samantha C, CNM  acetaminophen-codeine (TYLENOL #3) 300-30 MG tablet Take 2 tablets by mouth daily as needed for moderate pain.     [provider]  albuterol (PROVENTIL HFA;VENTOLIN HFA) 108 (90 Base) MCG/ACT inhaler Inhale 2 puffs into the lungs every 6 (six) hours as needed for wheezing or shortness of breath.    [provider]  dicloxacillin (DYNAPEN) 500 MG capsule Take 1 capsule (500 mg total)  by mouth every 6 (six) hours. 04/28/18   [place] Bing, MD  gabapentin (NEURONTIN) 600 MG tablet Take 600 mg by mouth at bedtime.    [provider]  ibuprofen (ADVIL,MOTRIN) 800 MG tablet Take 1 tablet (800 mg total) by mouth every 6 (six) hours. Patient not taking: Reported on 04/26/2018 04/21/18   Calvert Cantor, CNM  NIFEdipine (ADALAT CC) 30 MG 24 hr tablet Take 1 tablet (30 mg total) by mouth daily. 04/28/18   [place] Bing, MD  Prenat-FeAsp-Meth-FA-DHA w/o A (PRENATE PIXIE) 10-0.6-0.4-200 MG CAPS Take 1 tablet by mouth at bedtime.    [provider]  valACYclovir (VALTREX) 500 MG tablet Take 500 mg  by mouth 2 (two) times daily.     [provider]    Family History Family History  Problem Relation Age of Onset  . Bipolar disorder Mother        with anxiety and depression.  Biological father may also have anxiety.   . Migraines Mother   . Cancer Maternal Grandmother     Social History Social History   Tobacco Use  . Smoking status: Former Smoker    Last attempt to quit: 08/17/2017    Years since quitting: 0.8  . Smokeless tobacco: Never Used  Substance Use Topics  . Alcohol use: No  . Drug use: No     Allergies   Fish allergy; Shellfish allergy; and Oxycodone   Review of Systems Review of Systems  Constitutional: Negative for chills and fever.  Respiratory: Negative.  Negative for shortness of breath.   Cardiovascular: Negative.   Gastrointestinal: Positive for abdominal pain, nausea and vomiting. Negative for diarrhea.  Genitourinary: Positive for pelvic pain, vaginal bleeding and vaginal discharge. Negative for dysuria.  Musculoskeletal: Negative.   Skin: Negative.   Neurological: Negative.      Physical Exam Updated Vital Signs BP 132/84   Pulse 96   Temp 97.9 F (36.6 C) (Oral)   Resp 16   Ht 5\' 1"  (1.549 m)   Wt 54.9 kg   SpO2 100%   BMI 22.86 kg/m   Physical Exam Constitutional:      General: She is not in acute distress.    Appearance: She is well-developed. She is not ill-appearing.  HENT:     Head: Normocephalic.  Neck:     Musculoskeletal: Normal range of motion and neck supple.  Cardiovascular:     Rate and Rhythm: Normal rate and regular rhythm.  Pulmonary:     Effort: Pulmonary effort is normal.     Breath sounds: Normal breath sounds. No wheezing, rhonchi or rales.  Abdominal:     General: Bowel sounds are normal.     Palpations: Abdomen is soft.     Tenderness: There is abdominal tenderness (Generalized abdominal tenderness. ). There is no guarding or rebound.  Genitourinary:    Comments: No cervical bleeding  present. There is mild cervical discharge that is yellow in appearance. She is generally tender on pelvic exam, including CMT and L>R adnexal tenderness. No mass or fullness.  Musculoskeletal: Normal range of motion.  Skin:    General: Skin is warm and dry.     Findings: No rash.  Neurological:     Mental Status: She is alert and oriented to person, place, and time.      ED Treatments / Results  Labs (all labs ordered are listed, but only abnormal results are displayed) Labs Reviewed  CBC WITH DIFFERENTIAL/PLATELET - Abnormal; Notable for the following  components:      Result Value   MCH 25.7 (*)    All other components within normal limits  URINALYSIS, ROUTINE W REFLEX MICROSCOPIC - Abnormal; Notable for the following components:   Color, Urine STRAW (*)    Specific Gravity, Urine 1.003 (*)    All other components within normal limits  WET PREP, GENITAL  COMPREHENSIVE METABOLIC PANEL  I-STAT BETA HCG BLOOD, ED ([facility], [facility], [facility] ONLY)  GC/CHLAMYDIA PROBE AMP ([place]) NOT AT [facility]    EKG None  Radiology No results found.  Procedures Procedures (including critical care time)  Medications Ordered in ED Medications - No data to display   Initial Impression / Assessment and Plan / ED Course  I have reviewed the triage vital signs and the nursing notes.  Pertinent labs & imaging results that were available during my care of the patient were reviewed by me and considered in my medical decision making (see chart for details).        Patient to ED with one week of lower, L>R abdominal pain, N, V. No fever, urinary symptoms. Also has vaginal discharge x 2 days, and intermittent vaginal bleeding x 12 weeks since delivery by C-section. Pregnancy complicated by pre-eclampsia which necessitated a second admission in February. Normal blood pressures since.   On exam, there is no bleeding. Hgb is stable. Not pregnant. Normal lab studies. There is discharge, CMT and pelvic pain. Wet  prep pending.  Will obtain pelvic US for further evaluation.   Wet prep essentially negative. [place] also without acute findings. She has some generalized tenderness of the abdomen but without leukocytosis, fever, doubt the source of pain is abdominal.   She is referred to primary care. Recommended she continue Naproxen. Patient is agreeable to care plan.   Final Clinical Impressions(s) / ED Diagnoses   Final diagnoses:  Pelvic pain    ED Discharge Orders    None       Danne Harbor 07/10/18 0654    Nira Conn, MD 07/11/18 [phone]

## 2018-07-10 NOTE — Discharge Instructions (Addendum)
Your labs and pelvic ultrasound are negative for any acute finding. You can continue taking your Naproxen and call your doctor for an appointment for recheck of your pelvic pain.

## 2018-07-12 LAB — GC/CHLAMYDIA PROBE AMP (~~LOC~~) NOT AT ARMC
Chlamydia: NEGATIVE
Neisseria Gonorrhea: NEGATIVE

## 2018-08-31 ENCOUNTER — Telehealth: Payer: Self-pay | Admitting: Family Medicine

## 2018-08-31 ENCOUNTER — Encounter: Payer: Self-pay | Admitting: *Deleted

## 2018-08-31 NOTE — Telephone Encounter (Signed)
Patient is aware of her Virtual Mychart visit for 09-02-2018.

## 2018-09-01 ENCOUNTER — Telehealth: Payer: Self-pay | Admitting: Obstetrics & Gynecology

## 2018-09-01 NOTE — Telephone Encounter (Signed)
Attempted to call patient about her appointment scheduled for tomorrw, and how she needed to have her MyChart app downloaded in order to have this visit. VM left.

## 2018-09-02 ENCOUNTER — Telehealth (INDEPENDENT_AMBULATORY_CARE_PROVIDER_SITE_OTHER): Payer: Medicaid Other | Admitting: Obstetrics & Gynecology

## 2018-09-02 ENCOUNTER — Telehealth: Payer: Self-pay | Admitting: Obstetrics & Gynecology

## 2018-09-02 ENCOUNTER — Encounter: Payer: Self-pay | Admitting: Obstetrics & Gynecology

## 2018-09-02 ENCOUNTER — Other Ambulatory Visit: Payer: Self-pay

## 2018-09-02 DIAGNOSIS — R102 Pelvic and perineal pain unspecified side: Secondary | ICD-10-CM

## 2018-09-02 NOTE — Progress Notes (Signed)
I connected with  Jodi Flores on 09/02/18 at 11:15 AM EDT by telephone and verified that I am speaking with the correct person using two identifiers.   I discussed the limitations, risks, security and privacy concerns of performing an evaluation and management service by telephone and the availability of in person appointments. I also discussed with the patient that there may be a patient responsible charge related to this service. The patient expressed understanding and agreed to proceed.  [name], RN 09/02/2018  11:10 AM  Pt states that she has been experiencing 2 periods a month, heavy bleeding with clots.  Severe cramping constantly that is worse after sex.  Pt states she is unable to have sex d/t pain.

## 2018-09-02 NOTE — Telephone Encounter (Signed)
Called the patient to confirm the upcoming appointment. Left a detailed voicemail of the appointment date and time. Also informed of the office number if she has any questions or concerns.

## 2018-09-02 NOTE — Progress Notes (Signed)
   TELEHEALTH VIRTUAL GYNECOLOGY VISIT ENCOUNTER NOTE  I connected with Jodi Flores on 09/02/18 at 11:15 AM EDT by telephone at home and verified that I am speaking with the correct person using two identifiers.   I discussed the limitations, risks, security and privacy concerns of performing an evaluation and management service by telephone and the availability of in person appointments. I also discussed with the patient that there may be a patient responsible charge related to this service. The patient expressed understanding and agreed to proceed.   History:  Jodi Flores is a 18 y.o. single [id number] (18 yo and 4 month) female being evaluated today for "really bad cramps." She got a depo provera shot 4/20 and says that has made the pain worse. She does not bleed, just hurts. She has tried Naproxyn but this doesn't help. This has been present since delivery (cesarean section).  She also says that she has been having pain with sex for about a year. The pain with sex is "altogether just hurts". She also has recurrent BV. She is unsure what she wants for contraception because she doesn't want more depo provera, didn't like Nexplanon, couldn't remember to take the OCPs, and says that the [place] gave her infections.  She has been with her current sexual partner for about 3 years.      Past Medical History:  Diagnosis Date  . ADHD (attention deficit hyperactivity disorder)   . Anxiety   . Asthma 03/10/2013   Hx of asthma; no recent use of inhaler  . Common migraine with intractable migraine 12/19/2016  . Depression   . Headache   . HSV infection   . Pregnancy induced hypertension   . Suicidal overdose [facility])    Past Surgical History:  Procedure Laterality Date  . CESAREAN SECTION N/A 04/19/2018   Procedure: CESAREAN SECTION;  Surgeon: Osborne Oman, MD;  Location: [place];  Service: Obstetrics;  Laterality: N/A;  . NO PAST SURGERIES      The following portions of the patient's history were reviewed and updated as appropriate: allergies, current medications, past family history, past medical history, past social history, past surgical history and problem list.     Review of Systems:  Pertinent items noted in HPI and remainder of comprehensive ROS otherwise negative.  Physical Exam:   General:  Alert, oriented and cooperative.   Mental Status: Normal mood and affect perceived. Normal judgment and thought content.  Physical exam deferred due to nature of the encounter  Labs and Imaging No results found for this or any previous visit (from the past 336 hour(s)). No results found.    Assessment and Plan:  Dyspareunia- schedule gyn u/s Cramping- gyn u/s     Next appt needs to be in person  I discussed the assessment and treatment plan with the patient. The patient was provided an opportunity to ask questions and all were answered. The patient agreed with the plan and demonstrated an understanding of the instructions.   The patient was advised to call back or seek an in-person evaluation/go to the ED if the symptoms worsen or if the condition fails to improve as anticipated.  I provided 62minutes of non-face-to-face time during this encounter.   Emily Filbert, MD [facility], [place]

## 2018-09-15 ENCOUNTER — Telehealth: Payer: Self-pay | Admitting: Obstetrics & Gynecology

## 2018-09-15 NOTE — Telephone Encounter (Signed)
Attempted to call patient to ask her about any symptoms, and to wear her mask the whole visit covering her nose and mouth. Also, to sanitize her hands upon arriving as well as no visitors. Left a message for her to call us so we could give her some information about her appointment.

## 2018-09-16 ENCOUNTER — Telehealth: Payer: Self-pay | Admitting: Obstetrics & Gynecology

## 2018-09-16 ENCOUNTER — Ambulatory Visit: Payer: Medicaid Other | Admitting: Obstetrics & Gynecology

## 2018-09-16 VITALS — BP 134/93 | HR 94 | Temp 98.3°F | Wt 126.0 lb

## 2018-09-16 DIAGNOSIS — R102 Pelvic and perineal pain: Secondary | ICD-10-CM

## 2018-09-16 NOTE — Telephone Encounter (Signed)
Patient was called, and she stated she was coming in because she really needed to be seen. Instructions about the visit was given.

## 2018-09-16 NOTE — Progress Notes (Signed)
She came in to go over her u/s results, ordered to evaluate her pelvic pain. However, she has not had the u/s yet, ordered but not scheduled. I rec that she come back after u/s results available.

## 2019-11-25 ENCOUNTER — Other Ambulatory Visit: Payer: Self-pay

## 2019-11-25 ENCOUNTER — Encounter (HOSPITAL_COMMUNITY): Payer: Self-pay | Admitting: Obstetrics and Gynecology

## 2019-11-25 ENCOUNTER — Inpatient Hospital Stay (HOSPITAL_BASED_OUTPATIENT_CLINIC_OR_DEPARTMENT_OTHER): Payer: Medicaid Other

## 2019-11-25 ENCOUNTER — Inpatient Hospital Stay (HOSPITAL_COMMUNITY)
Admission: AD | Admit: 2019-11-25 | Discharge: 2019-11-25 | Disposition: A | Discharge status: Home / Self Care | Payer: Medicaid Other | Source: Ambulatory Visit | Attending: Obstetrics and Gynecology | Admitting: Obstetrics and Gynecology

## 2019-11-25 DIAGNOSIS — O34219 Maternal care for unspecified type scar from previous cesarean delivery: Secondary | ICD-10-CM | POA: Diagnosis not present

## 2019-11-25 DIAGNOSIS — Z3A35 35 weeks gestation of pregnancy: Secondary | ICD-10-CM | POA: Diagnosis not present

## 2019-11-25 DIAGNOSIS — O133 Gestational [pregnancy-induced] hypertension without significant proteinuria, third trimester: Secondary | ICD-10-CM | POA: Diagnosis not present

## 2019-11-25 DIAGNOSIS — Z3689 Encounter for other specified antenatal screening: Secondary | ICD-10-CM | POA: Diagnosis not present

## 2019-11-25 DIAGNOSIS — O139 Gestational [pregnancy-induced] hypertension without significant proteinuria, unspecified trimester: Secondary | ICD-10-CM | POA: Diagnosis not present

## 2019-11-25 DIAGNOSIS — O26893 Other specified pregnancy related conditions, third trimester: Secondary | ICD-10-CM | POA: Insufficient documentation

## 2019-11-25 DIAGNOSIS — R519 Headache, unspecified: Secondary | ICD-10-CM | POA: Insufficient documentation

## 2019-11-25 DIAGNOSIS — O26853 Spotting complicating pregnancy, third trimester: Secondary | ICD-10-CM | POA: Diagnosis not present

## 2019-11-25 DIAGNOSIS — O4693 Antepartum hemorrhage, unspecified, third trimester: Secondary | ICD-10-CM | POA: Diagnosis not present

## 2019-11-25 DIAGNOSIS — Z791 Long term (current) use of non-steroidal anti-inflammatories (NSAID): Secondary | ICD-10-CM | POA: Diagnosis not present

## 2019-11-25 DIAGNOSIS — O99513 Diseases of the respiratory system complicating pregnancy, third trimester: Secondary | ICD-10-CM | POA: Diagnosis not present

## 2019-11-25 DIAGNOSIS — Z87891 Personal history of nicotine dependence: Secondary | ICD-10-CM | POA: Insufficient documentation

## 2019-11-25 DIAGNOSIS — O98513 Other viral diseases complicating pregnancy, third trimester: Secondary | ICD-10-CM | POA: Insufficient documentation

## 2019-11-25 DIAGNOSIS — F419 Anxiety disorder, unspecified: Secondary | ICD-10-CM | POA: Diagnosis not present

## 2019-11-25 DIAGNOSIS — Z79899 Other long term (current) drug therapy: Secondary | ICD-10-CM | POA: Insufficient documentation

## 2019-11-25 DIAGNOSIS — F329 Major depressive disorder, single episode, unspecified: Secondary | ICD-10-CM | POA: Diagnosis not present

## 2019-11-25 DIAGNOSIS — Z7901 Long term (current) use of anticoagulants: Secondary | ICD-10-CM | POA: Insufficient documentation

## 2019-11-25 DIAGNOSIS — O99343 Other mental disorders complicating pregnancy, third trimester: Secondary | ICD-10-CM | POA: Diagnosis not present

## 2019-11-25 DIAGNOSIS — J45909 Unspecified asthma, uncomplicated: Secondary | ICD-10-CM | POA: Insufficient documentation

## 2019-11-25 DIAGNOSIS — N939 Abnormal uterine and vaginal bleeding, unspecified: Secondary | ICD-10-CM

## 2019-11-25 LAB — CBC
HCT: 28.7 % — ABNORMAL LOW (ref 36.0–46.0)
Hemoglobin: 9 g/dL — ABNORMAL LOW (ref 12.0–15.0)
MCH: 27 pg (ref 26.0–34.0)
MCHC: 31.4 g/dL (ref 30.0–36.0)
MCV: 86.2 fL (ref 80.0–100.0)
Platelets: 161 10*3/uL (ref 150–400)
RBC: 3.33 MIL/uL — ABNORMAL LOW (ref 3.87–5.11)
RDW: 15.3 % (ref 11.5–15.5)
WBC: 6.6 10*3/uL (ref 4.0–10.5)
nRBC: 0 % (ref 0.0–0.2)

## 2019-11-25 LAB — URINALYSIS, ROUTINE W REFLEX MICROSCOPIC
Bilirubin Urine: NEGATIVE
Glucose, UA: NEGATIVE mg/dL
Hgb urine dipstick: NEGATIVE
Ketones, ur: NEGATIVE mg/dL
Leukocytes,Ua: NEGATIVE
Nitrite: NEGATIVE
Protein, ur: NEGATIVE mg/dL
Specific Gravity, Urine: 1.004 — ABNORMAL LOW (ref 1.005–1.030)
pH: 8 (ref 5.0–8.0)

## 2019-11-25 LAB — RAPID URINE DRUG SCREEN, HOSP PERFORMED
Amphetamines: NOT DETECTED
Barbiturates: NOT DETECTED
Benzodiazepines: NOT DETECTED
Cocaine: NOT DETECTED
Opiates: NOT DETECTED
Tetrahydrocannabinol: NOT DETECTED

## 2019-11-25 LAB — WET PREP, GENITAL
Clue Cells Wet Prep HPF POC: NONE SEEN
Sperm: NONE SEEN
Trich, Wet Prep: NONE SEEN
Yeast Wet Prep HPF POC: NONE SEEN

## 2019-11-25 MED ORDER — CYCLOBENZAPRINE HCL 5 MG PO TABS
10.0000 mg | ORAL_TABLET | Freq: Once | ORAL | Status: AC
  Administered 2019-11-25: 10 mg via ORAL
  Filled 2019-11-25: qty 2

## 2019-11-25 MED ORDER — ACETAMINOPHEN 500 MG PO TABS
1000.0000 mg | ORAL_TABLET | Freq: Once | ORAL | Status: AC
  Administered 2019-11-25: 1000 mg via ORAL
  Filled 2019-11-25: qty 2

## 2019-11-25 MED ORDER — LACTATED RINGERS IV BOLUS
1000.0000 mL | Freq: Once | INTRAVENOUS | Status: AC
  Administered 2019-11-25: 1000 mL via INTRAVENOUS

## 2019-11-25 NOTE — MAU Note (Signed)
Presents with c/o VB x2 days.  Denies recent intercourse.  Was seen yesterday @ [facility] given Terbutaline x1 dose but was not concerned about VB.  Denies LOF.  Endorses +FM x1, but hasn't eaten today.

## 2019-11-25 NOTE — Discharge Instructions (Signed)

## 2019-11-25 NOTE — MAU Provider Note (Signed)
History     CSN: [id number]  Arrival date and time: 11/25/19 1021   First Provider Initiated Contact with Patient 11/25/19 1120      Chief Complaint  Patient presents with  . Vaginal Bleeding   HPI Jodi Flores is a 19 y.o. [id number] at [redacted]w[redacted]d who presents to MAU with chief complaint of vaginal spotting. This is a new problem, onset two days ago. Patient reports seeing smears of pink fluid when she wipes after voiding. She denies dysuria, abdominal pain, abdominal tenderness, fever or recent illness. She is remote from sexual intercourse.  Patient also c/o bilateral headache near her jaw. She rates her pain as 7/10. She denies aggravating or alleviating factors. She has not taken medication or tried other treatments for this complaint.  Patient endorses history of anemia which she is managing with her PNV and a separate Fe supplement. She reports to CNM that she takes them "when I remember". She denies dizziness, SOB, weakness, syncope.  Patient states she was evaluated for preterm contractions and vaginal spotting at [facility] yesterday. She states "they didn't do nothing for me, just gave me a shot of something and didn't do anything else". She states she was told her cervix was 1.5cm and discharged home.  Patient receives her care with [facility] and her next appointment is 09/07. She is planning elective repeat cesarean, scheduled for 12/20/2019 per patient.  OB History    Gravida  3   Para  2   Term  2   Preterm      AB      Living  2     SAB      TAB      Ectopic      Multiple  0   Live Births  2           Past Medical History:  Diagnosis Date  . ADHD (attention deficit hyperactivity disorder)   . Anxiety   . Asthma 03/10/2013   Hx of asthma; no recent use of inhaler  . Common migraine with intractable migraine 12/19/2016  . Depression   . Headache   . HSV infection   . Pregnancy induced hypertension   . Suicidal  overdose [facility])     Past Surgical History:  Procedure Laterality Date  . CESAREAN SECTION N/A 04/19/2018   Procedure: CESAREAN SECTION;  Surgeon: Tereso Newcomer, MD;  [facility];  Service: Obstetrics;  Laterality: N/A;  . NO PAST SURGERIES      Family History  Problem Relation Age of Onset  . Bipolar disorder Mother        with anxiety and depression.  Biological father may also have anxiety.   . Migraines Mother   . Cancer Maternal Grandmother     Social History   Tobacco Use  . Smoking status: Former Smoker    Quit date: 08/17/2017    Years since quitting: 2.2  . Smokeless tobacco: Never Used  Vaping Use  . Vaping Use: Never used  Substance Use Topics  . Alcohol use: No  . Drug use: No    Allergies:  Allergies  Allergen Reactions  . Fish Allergy Nausea And Vomiting  . Shellfish Allergy Nausea And Vomiting  . Oxycodone Other (See Comments)    Blisters in mouth    Medications Prior to Admission  Medication Sig Dispense Refill Last Dose  . Metoprolol Tartrate (FIRST - METOPROLOL PO) Take by mouth.   11/25/2019 at 0830  .  valACYclovir (VALTREX) 500 MG tablet Take 500 mg by mouth 2 (two) times daily.    11/24/2019 at Unknown time  . acetaminophen-codeine (TYLENOL #3) 300-30 MG tablet Take 2 tablets by mouth daily as needed for moderate pain.      Marland Kitchen albuterol (PROVENTIL HFA;VENTOLIN HFA) 108 (90 Base) MCG/ACT inhaler Inhale 2 puffs into the lungs every 6 (six) hours as needed for wheezing or shortness of breath.   More than a month at Unknown time  . ALPRAZolam (XANAX) 0.5 MG tablet Take 0.5 mg by mouth at bedtime as needed for anxiety.     Marland Kitchen amoxicillin-clavulanate (AUGMENTIN) 875-125 MG tablet Take 1 tablet by mouth 2 (two) times daily.     Marland Kitchen dicyclomine (BENTYL) 20 MG tablet Take 20 mg by mouth every 6 (six) hours.     . gabapentin (NEURONTIN) 600 MG tablet Take 600 mg by mouth at bedtime.     Marland Kitchen lithium carbonate 300 MG capsule Take 300 mg by mouth 3  (three) times daily with meals.     . naproxen (NAPROSYN) 500 MG tablet Take 500 mg by mouth 2 (two) times daily with a meal.     . Prenat-FeAsp-Meth-FA-DHA w/o A (PRENATE PIXIE) 10-0.6-0.4-200 MG CAPS Take 1 tablet by mouth at bedtime.       Review of Systems  Gastrointestinal: Negative for abdominal pain.  Genitourinary: Positive for vaginal bleeding and vaginal discharge.  Musculoskeletal: Negative for back pain.  All other systems reviewed and are negative.  Physical Exam   Blood pressure 115/77, pulse (!) 103, temperature 98.4 F (36.9 C), temperature source Oral, resp. rate 20, height 5\' 1"  (1.549 m), weight 62.8 kg, SpO2 97 %, unknown if currently breastfeeding.  Physical Exam Vitals and nursing note reviewed. Exam conducted with a chaperone present.  Cardiovascular:     Rate and Rhythm: Tachycardia present.     Pulses: Normal pulses.     Heart sounds: Normal heart sounds.  Pulmonary:     Effort: Pulmonary effort is normal.     Breath sounds: Normal breath sounds.  Abdominal:     Comments: Gravid  Genitourinary:    Comments: Pelvic exam: External genitalia normal, vaginal walls pink and well rugated, cervix visually closed, no lesions noted. Bimanual: cervix 1.5 externally, closed internally, /thick/posterior, neg CMT Skin:    General: Skin is warm.     Capillary Refill: Capillary refill takes less than 2 seconds.  Neurological:     General: No focal deficit present.     Mental Status: She is alert.     MAU Course/MDM  Procedures: speculum, OB MFM Limited  --OB history: primary cesarean for HSV outbreak, postpartum admission for postpartum PEC --Reactive tracing: baseline 135, mod var, + 15 x 15 accels, no decels --Toco: UI, resolving with fluid bolus --Headache resolved with meds given in MAU  Orders Placed This Encounter  Procedures  . Wet prep, genital  . MFM OB LIMITED  . Urinalysis, Routine w reflex microscopic Urine, Clean Catch  . CBC  . Rapid  urine drug screen (hospital performed)  . Insert peripheral IV   Patient Vitals for the past 24 hrs:  BP Temp Temp src Pulse Resp SpO2 Height Weight  11/25/19 1310 103/67 97.6 F (36.4 C) Oral 74 18 100 % -- --  11/25/19 1103 115/77 98.4 F (36.9 C) Oral (!) 103 20 97 % -- --  11/25/19 1053 -- -- -- -- -- -- 5\' 1"  (1.549 m) 62.8 kg   Results  for orders placed or performed during the hospital encounter of 11/25/19 (from the past 24 hour(s))  Wet prep, genital     Status: Abnormal   Collection Time: 11/25/19 11:30 AM  Result Value Ref Range   Yeast Wet Prep HPF POC NONE SEEN NONE SEEN   Trich, Wet Prep NONE SEEN NONE SEEN   Clue Cells Wet Prep HPF POC NONE SEEN NONE SEEN   WBC, Wet Prep HPF POC FEW (A) NONE SEEN   Sperm NONE SEEN   Rapid urine drug screen (hospital performed)     Status: None   Collection Time: 11/25/19 11:38 AM  Result Value Ref Range   Opiates NONE DETECTED NONE DETECTED   Cocaine NONE DETECTED NONE DETECTED   Benzodiazepines NONE DETECTED NONE DETECTED   Amphetamines NONE DETECTED NONE DETECTED   Tetrahydrocannabinol NONE DETECTED NONE DETECTED   Barbiturates NONE DETECTED NONE DETECTED  Urinalysis, Routine w reflex microscopic Urine, Clean Catch     Status: Abnormal   Collection Time: 11/25/19 11:52 AM  Result Value Ref Range   Color, Urine STRAW (A) YELLOW   APPearance CLEAR CLEAR   Specific Gravity, Urine 1.004 (L) 1.005 - 1.030   pH 8.0 5.0 - 8.0   Glucose, UA NEGATIVE NEGATIVE mg/dL   Hgb urine dipstick NEGATIVE NEGATIVE   Bilirubin Urine NEGATIVE NEGATIVE   Ketones, ur NEGATIVE NEGATIVE mg/dL   Protein, ur NEGATIVE NEGATIVE mg/dL   Nitrite NEGATIVE NEGATIVE   Leukocytes,Ua NEGATIVE NEGATIVE  CBC     Status: Abnormal   Collection Time: 11/25/19 12:13 PM  Result Value Ref Range   WBC 6.6 4.0 - 10.5 K/uL   RBC 3.33 (L) 3.87 - 5.11 MIL/uL   Hemoglobin 9.0 (L) 12.0 - 15.0 g/dL   HCT 35.3 (L) 36 - 46 %   MCV 86.2 80.0 - 100.0 fL   MCH 27.0 26.0 -  34.0 pg   MCHC 31.4 30.0 - 36.0 g/dL   RDW 29.9 24.2 - 68.3 %   Platelets 161 150 - 400 K/uL   nRBC 0.0 0.0 - 0.2 %   Meds ordered this encounter  Medications  . lactated ringers bolus 1,000 mL  . acetaminophen (TYLENOL) tablet 1,000 mg  . cyclobenzaprine (FLEXERIL) tablet 10 mg     Assessment and Plan  --19 y.o. [id number] at [redacted]w[redacted]d  --Reactive tracing --Closed cervix (1.5 external os) --No concerning findings on exam or MFM OB Limited (Media tab) --A PO --Patient denies pain at time of discharge --Discharge home in stable condition  F/U: --Next appt [facility] is 11/24/2019  Calvert Cantor, CNM 11/25/2019, 4:12 PM

## 2019-11-29 LAB — GC/CHLAMYDIA PROBE AMP (~~LOC~~) NOT AT ARMC
Chlamydia: NEGATIVE
Comment: NEGATIVE
Comment: NORMAL
Neisseria Gonorrhea: NEGATIVE

## 2019-12-02 IMAGING — US US OB TRANSVAGINAL
1 series · 14 of 28 positions shown · non-contrast
Comparison: None.

CLINICAL DATA: Cramping during the first trimester pregnancy.

EXAM:
OBSTETRIC <14 WK US AND TRANSVAGINAL OB US
TECHNIQUE: Both transabdominal and transvaginal ultrasound examinations were
performed for complete evaluation of the gestation as well as the
maternal uterus, adnexal regions, and pelvic cul-de-sac.
Transvaginal technique was performed to assess early pregnancy.

[Series 1: us ob transvaginal · 0.23mm/px · 43 acquisitions, 14 frames shown]
[im 2/43]
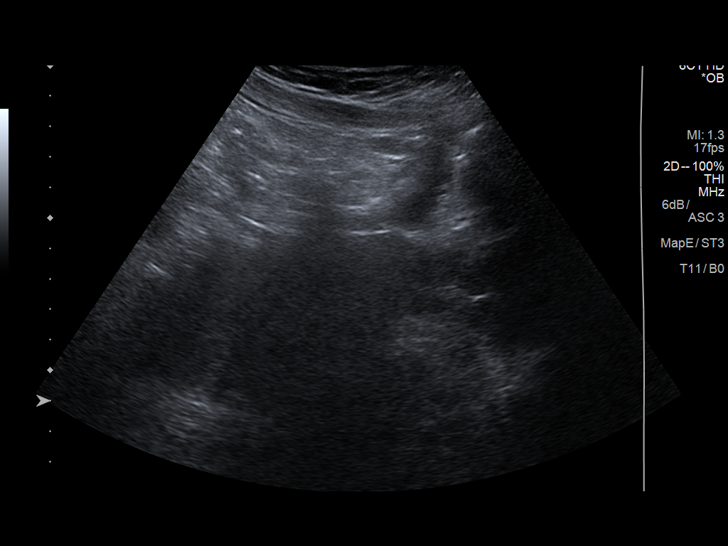
[im 5/43]
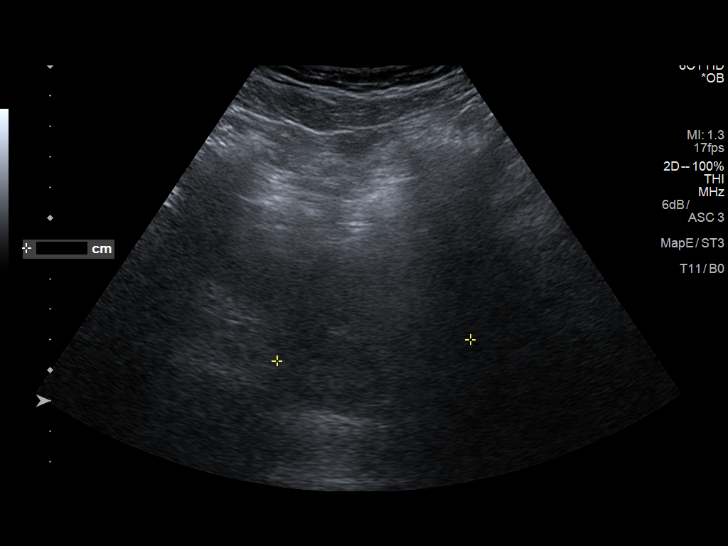
[im 8/43]
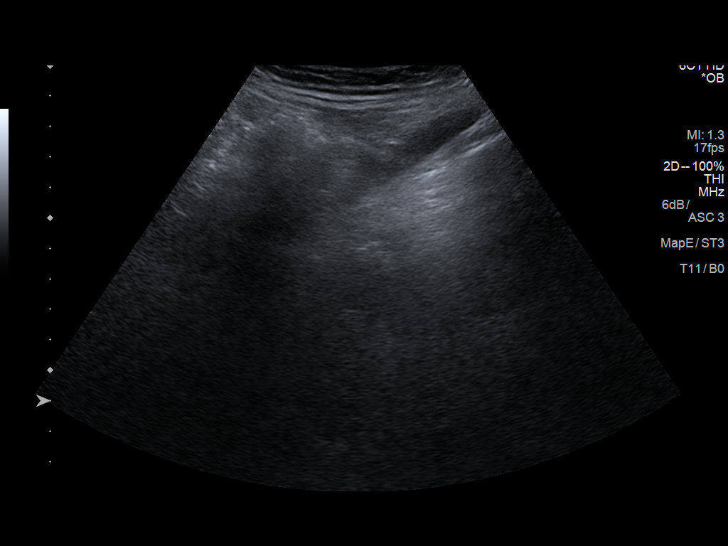
[im 11/43]
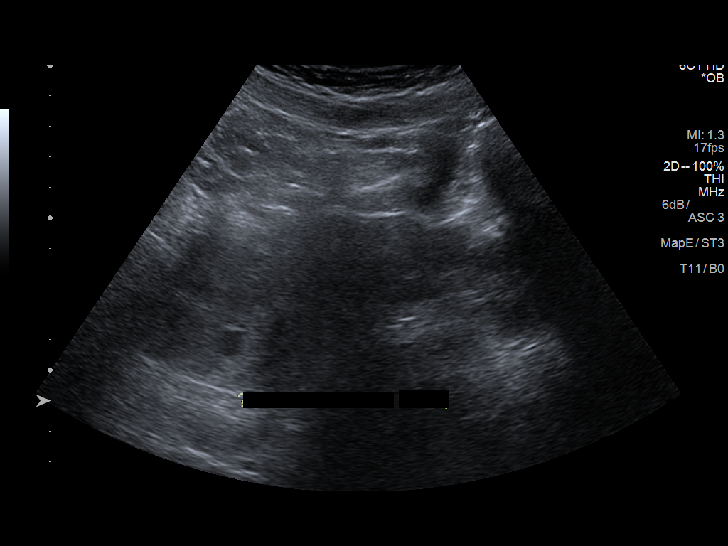
[im 15/43]
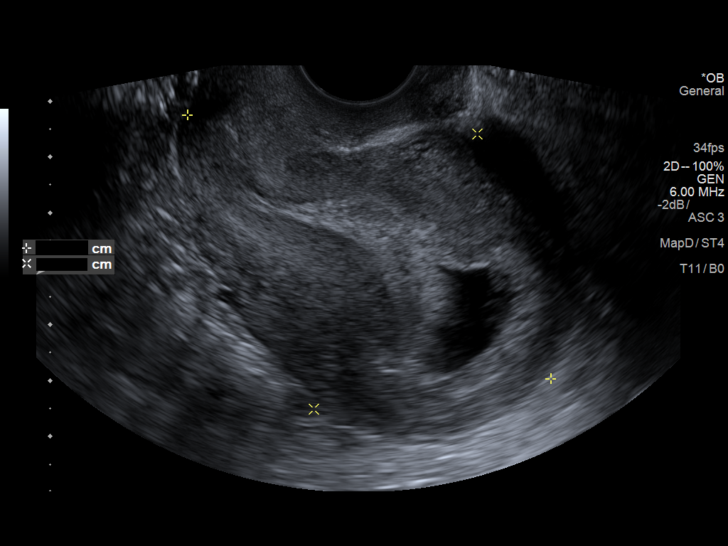
[im 18/43]
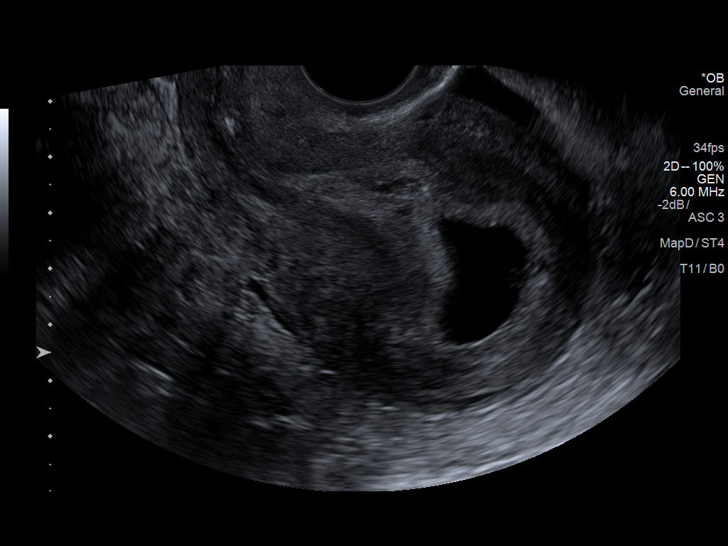
[im 21/43]
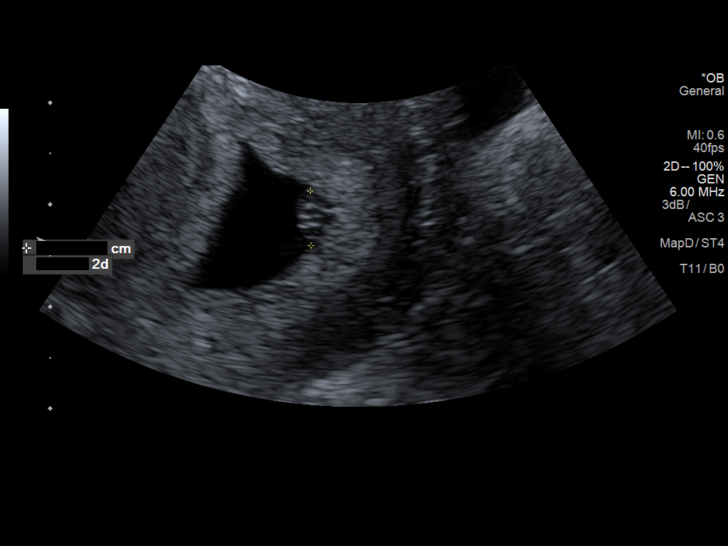
[im 24/43]
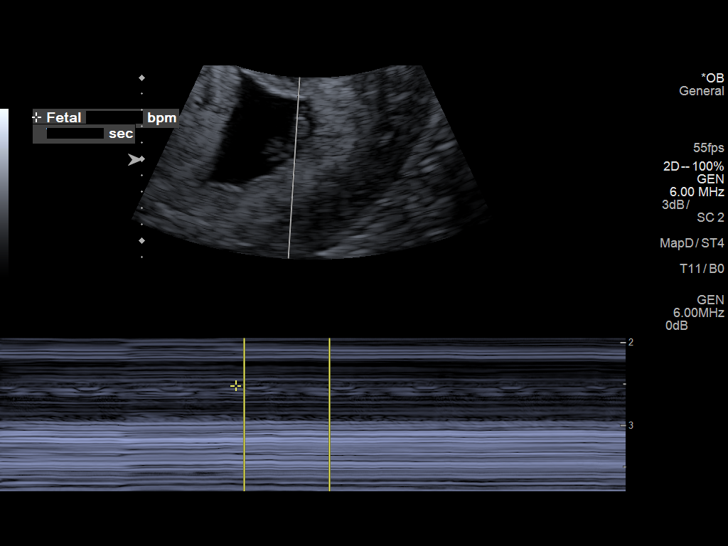
[im 27/43]
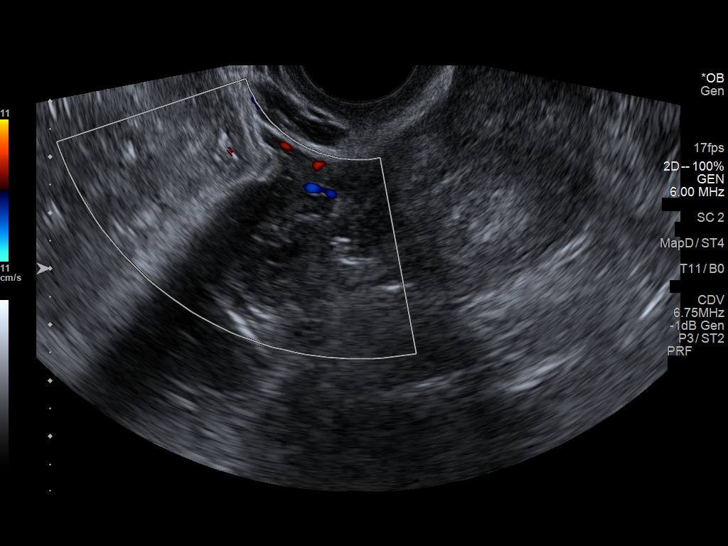
[im 30/43]
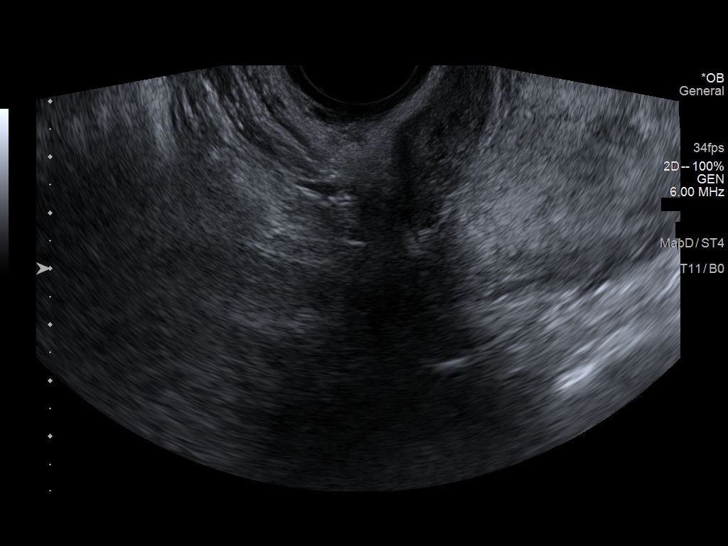
[im 33/43]
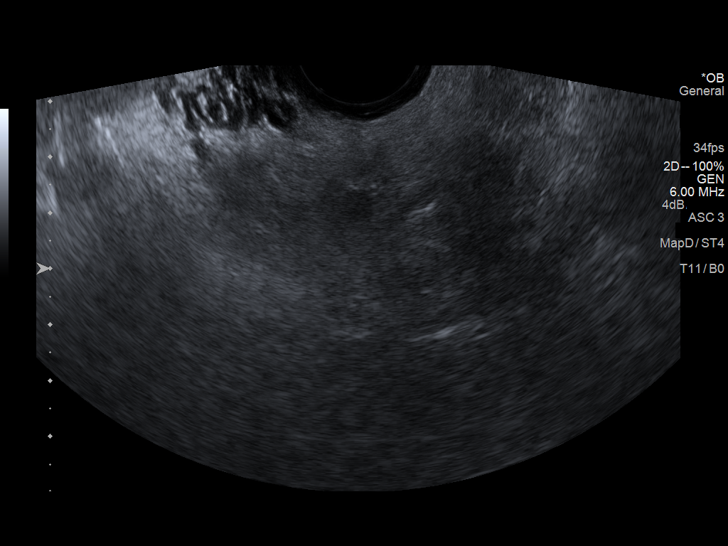
[im 36/43]
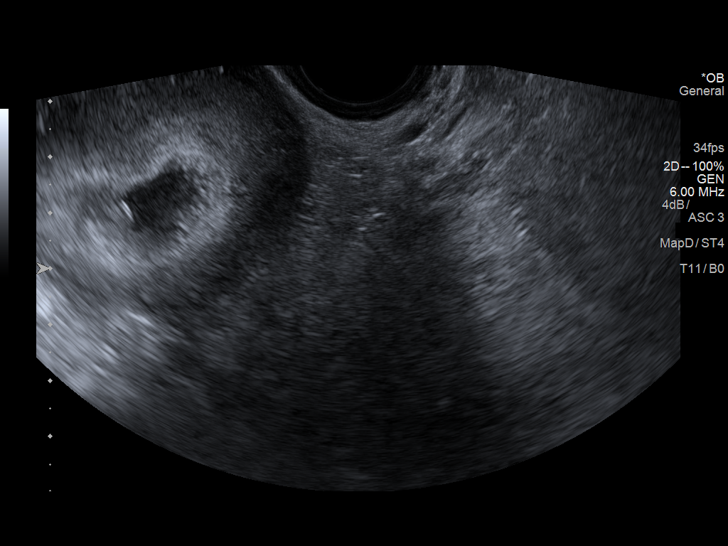
[im 39/43]
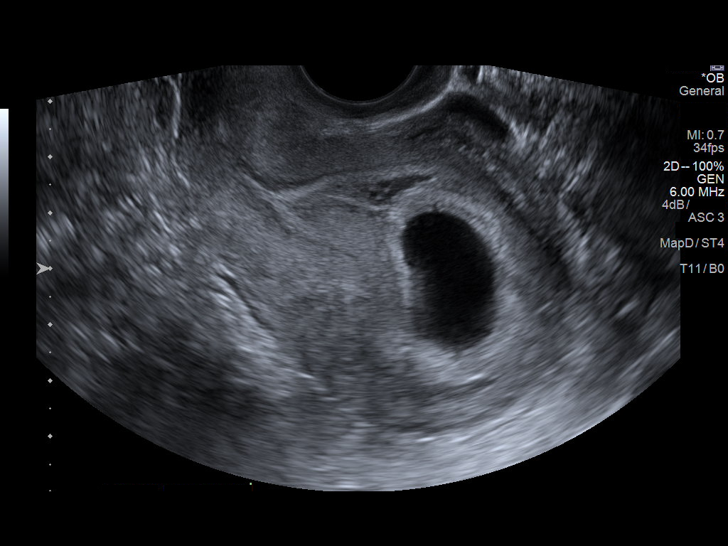
[im 43/43]
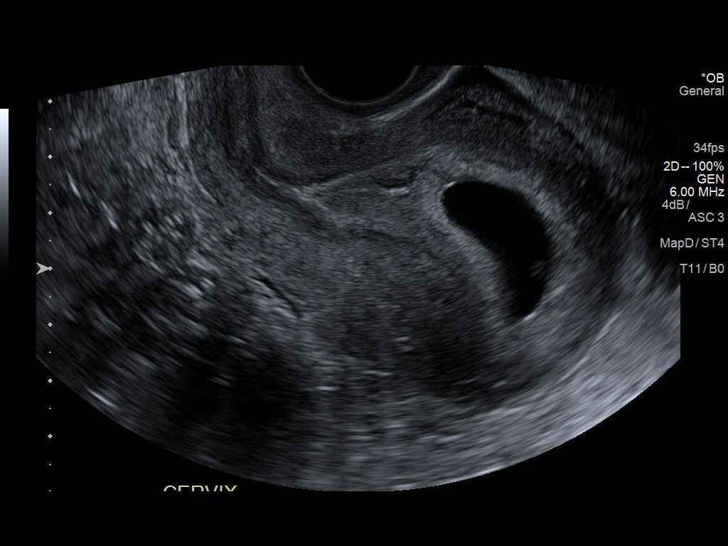

[14 of 28 positions shown; findings below may reference images not displayed]

FINDINGS: Intrauterine gestational sac: Single

Yolk sac:  Visualized.

Embryo:  Visualized.

Cardiac Activity: Visualized.

Heart Rate: 120 bpm

CRL:  5.1 mm   6 w   2 d                  US EDC: 05/13/2018

Subchorionic hemorrhage:  None visualized.

Maternal uterus/adnexae: Normal right ovary. Left ovary was obscured
by bowel gas. The uterus is unremarkable. Trace free fluid is seen.
IMPRESSION: Viable intrauterine 6 week 2 day gestation without complicating
features.

## 2023-11-07 ENCOUNTER — Ambulatory Visit (HOSPITAL_BASED_OUTPATIENT_CLINIC_OR_DEPARTMENT_OTHER)
Admission: EM | Admit: 2023-11-07 | Discharge: 2023-11-07 | Disposition: A | Discharge status: Home / Self Care | Payer: MEDICAID | Attending: Family Medicine | Admitting: Family Medicine

## 2023-11-07 ENCOUNTER — Other Ambulatory Visit: Payer: Self-pay

## 2023-11-07 ENCOUNTER — Encounter (HOSPITAL_BASED_OUTPATIENT_CLINIC_OR_DEPARTMENT_OTHER): Payer: Self-pay

## 2023-11-07 DIAGNOSIS — R300 Dysuria: Secondary | ICD-10-CM | POA: Insufficient documentation

## 2023-11-07 LAB — POCT URINE DIPSTICK
Bilirubin, UA: NEGATIVE
Glucose, UA: NEGATIVE mg/dL
Ketones, POC UA: NEGATIVE mg/dL
Nitrite, UA: NEGATIVE
POC PROTEIN,UA: 30 — AB
Spec Grav, UA: 1.01 (ref 1.010–1.025)
Urobilinogen, UA: 0.2 U/dL
pH, UA: 6.5 (ref 5.0–8.0)

## 2023-11-07 MED ORDER — NITROFURANTOIN MONOHYD MACRO 100 MG PO CAPS: 100.0000 mg | ORAL_CAPSULE | Freq: Two times a day (BID) | ORAL | 0 refills | Status: AC

## 2023-11-07 NOTE — ED Triage Notes (Signed)
 Pt is here with frequency, blood, itching, odor, burning, discharge when urination that started 3 days ago, pt has not taken to relieve discomfort.

## 2023-11-07 NOTE — Discharge Instructions (Signed)
 Treating you for urinary tract infection.  Take the medications as prescribed.  Patient to drink plenty fluids.  We will call with any changes if needed with the culture results.  Follow-up as needed

## 2023-11-07 NOTE — ED Provider Notes (Signed)
 [facility]    CSN: [id number] Arrival date & time: 11/07/23  1102      History   Chief Complaint Chief Complaint  Patient presents with   UTI    HPI Jodi Flores is a 23 y.o. female.   Patient is a 23 year old female who presents today for UTI symptoms. Pt is here with frequency, blood, vaginal itching, odor, burning, discharge when urination that started 3 days ago. Pt has not taken to relieve discomfort.  Patient is not concerned for STDs at this time.  She was recently checked a few days ago with negative results.  Denies any fever, chills or pelvic pain     Past Medical History:  Diagnosis Date   ADHD (attention deficit hyperactivity disorder)    Anxiety    Asthma 03/10/2013   Hx of asthma; no recent use of inhaler   Common migraine with intractable migraine 12/19/2016   Depression    Headache    HSV infection    Pregnancy induced hypertension    Suicidal overdose [facility])     Patient Active Problem List   Diagnosis Date Noted   Pre-eclampsia in postpartum period 04/26/2018   Severe pre-eclampsia, postpartum 04/26/2018   Preeclampsia, 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   HSV-2 infection complicating pregnancy, 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   History of suicide attempt 04/19/2018   Lithium use 04/19/2018   S/P cesarean section 04/19/2018   Common migraine with intractable migraine 12/19/2016   Bipolar disorder (HCC) 03/10/2013   Oppositional defiant disorder 03/10/2013   ADHD (attention deficit hyperactivity disorder), 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 Authorizing Provider  fluticasone (FLONASE) 50 MCG/ACT nasal spray  03/21/20  Yes [provider]  guanFACINE (INTUNIV) 1 MG TB24 ER tablet Take 1 mg by mouth. 08/28/21  Yes [provider]  ibuprofen  (ADVIL ) 800 MG tablet Take 800 mg by mouth. 05/03/22  Yes [provider]  medroxyPROGESTERone  (PROVERA ) 10 MG tablet Take 10 mg by mouth. 03/21/22  Yes [provider]  nitrofurantoin , macrocrystal-monohydrate, (MACROBID ) 100 MG capsule Take 1 capsule (100 mg total) by mouth 2 (two) times daily. 11/07/23  Yes Alexandrea Westergard A, FNP  omeprazole (PRILOSEC) 40 MG capsule Take 40 mg by mouth every 12 (twelve) hours. 07/29/23  Yes [provider]  rizatriptan (MAXALT) 5 MG tablet Take 5 mg by mouth. 10/12/23 10/11/24 Yes [provider]  tiZANidine (ZANAFLEX) 4 MG tablet TAKE 1 TABLET BY MOUTH EVERY 6 HOURS AS NEEDED FOR MUSCLE SPASMS FOR UP TO 10 DAYS 07/16/23  Yes [provider]  traMADol  (ULTRAM ) 50 MG tablet TAKE 1 TABLET BY MOUTH EVERY 6 HOURS - EVERY 8 HOURS AS  NEEDED FOR PAIN 05/03/22  Yes [provider]  valACYclovir  (VALTREX ) 1000 MG tablet Take 1,000 mg by mouth. 03/01/21  Yes [provider]  albuterol  (PROVENTIL  HFA;VENTOLIN  HFA) 108 (90 Base) MCG/ACT inhaler Inhale 2 puffs into the lungs every 6 (six) hours as needed for wheezing or shortness of breath.    [provider]  amphetamine-dextroamphetamine (ADDERALL) 20 MG tablet Take 20 mg by mouth.    [provider]  escitalopram (LEXAPRO) 10 MG tablet Take 10 mg by mouth.    [provider]  losartan (COZAAR) 25 MG tablet Take 25 mg by mouth.    [provider]  metoCLOPramide (REGLAN) 10 MG tablet Take 10 mg by mouth Four (4) times a day.    [provider]  Metoprolol Tartrate (FIRST - METOPROLOL PO)  Take by mouth.    [provider]  prazosin (MINIPRESS) 1 MG capsule Take 1 mg by mouth.    [provider]  valACYclovir  (VALTREX ) 500 MG tablet Take 500 mg by mouth 2 (two) times daily.     [provider]    Family History Family History  Problem Relation Age of Onset   Bipolar disorder Mother        with anxiety and depression.  Biological father may also have anxiety.    Migraines Mother    Cancer Maternal Grandmother     Social History Social History   Tobacco Use   Smoking status: Former    Current packs/day: 0.00    Types: Cigarettes    Quit date: 08/17/2017    Years since quitting: 6.2   Smokeless tobacco: Never  Vaping Use   Vaping status: Never Used  Substance Use Topics   Alcohol use: No   Drug use: No     Allergies   Fish allergy, Fish-derived products, Oxycodone , Shellfish allergy, Shellfish-derived products, Acetaminophen , and Oxycodone  hcl   Review of Systems Review of Systems  See HPI Physical Exam Triage Vital Signs ED Triage Vitals  Encounter Vitals Group     BP 11/07/23 1123 113/81     Girls Systolic BP Percentile --      Girls Diastolic BP Percentile --      Boys Systolic BP Percentile --      Boys Diastolic BP Percentile --      Pulse Rate 11/07/23 1123 95     Resp 11/07/23 1123 16     Temp 11/07/23 1123 98 F (36.7 C)     Temp Source 11/07/23 1123 Oral     SpO2 11/07/23 1123 99 %     Weight --      Height --      Head Circumference --      Peak Flow --      Pain Score 11/07/23 1116 0     Pain Loc --      Pain Education --      Exclude from Growth Chart --    No data found.  Updated Vital Signs BP 113/81 (BP Location: Right Arm)   Pulse 95   Temp 98 F (36.7 C) (Oral)   Resp 16   LMP  (LMP Unknown)   SpO2 99%   Breastfeeding No   Visual Acuity Right Eye Distance:   Left Eye Distance:   Bilateral Distance:    Right Eye Near:   Left Eye Near:    Bilateral Near:     Physical Exam Vitals  and nursing note reviewed.  Constitutional:  General: She is not in acute distress.    Appearance: Normal appearance. She is not ill-appearing, toxic-appearing or diaphoretic.  Pulmonary:     Effort: Pulmonary effort is normal.  Neurological:     Mental Status: She is alert.  Psychiatric:        Mood and Affect: Mood normal.      UC Treatments / Results  Labs (all labs ordered are listed, but only abnormal results are displayed) Labs Reviewed  POCT URINE DIPSTICK - Abnormal; Notable for the following components:      Result Value   Clarity, UA cloudy (*)    Blood, UA moderate (*)    POC PROTEIN,UA =30 (*)    Leukocytes, UA Large (3+) (*)    All other components within normal limits  URINE CULTURE    EKG   Radiology No results found.  Procedures Procedures (including critical care time)  Medications Ordered in UC Medications - No data to display  Initial Impression / Assessment and Plan / UC Course  I have reviewed the triage vital signs and the nursing notes.  Pertinent labs & imaging results that were available during my care of the patient were reviewed by me and considered in my medical decision making (see chart for details).     Dysuria-urine with large leuks, moderate blood and cloudy.  Consistent with urinary tract infection.  Sending for culture.  Will go ahead and treat with Macrobid  at this time.  Recommend push fluids.  Follow-up as needed Final Clinical Impressions(s) / UC Diagnoses   Final diagnoses:  Dysuria     Discharge Instructions      Treating you for urinary tract infection.  Take the medications as prescribed.  Patient to drink plenty fluids.  We will call with any changes if needed with the culture results.  Follow-up as needed   ED Prescriptions     Medication Sig Dispense Auth. Provider   nitrofurantoin , macrocrystal-monohydrate, (MACROBID ) 100 MG capsule Take 1 capsule (100 mg total) by mouth 2 (two) times daily. 10 capsule  Adah Wilbert LABOR, FNP      PDMP not reviewed this encounter.   Adah Wilbert LABOR, FNP 11/07/23 [id number]

## 2023-11-08 LAB — URINE CULTURE: Culture: NO GROWTH

## 2023-11-09 ENCOUNTER — Ambulatory Visit (HOSPITAL_COMMUNITY): Payer: Self-pay
